# Patient Record
Sex: Female | Born: 1945 | Race: White | Hispanic: No | Marital: Married | Smoking: Current every day smoker
Health system: Southern US, Community
[De-identification: ages and names within clinical notes are randomized; demographics above are authoritative.]

## PROBLEM LIST (undated history)

## (undated) DIAGNOSIS — I10 Essential (primary) hypertension: Secondary | ICD-10-CM

## (undated) DIAGNOSIS — J449 Chronic obstructive pulmonary disease, unspecified: Secondary | ICD-10-CM

## (undated) DIAGNOSIS — I509 Heart failure, unspecified: Secondary | ICD-10-CM

---

## 1998-05-24 ENCOUNTER — Other Ambulatory Visit: Admission: RE | Admit: 1998-05-24 | Discharge: 1998-05-24 | Payer: Self-pay | Admitting: *Deleted

## 1999-05-06 ENCOUNTER — Inpatient Hospital Stay (HOSPITAL_COMMUNITY): Admission: AD | Admit: 1999-05-06 | Discharge: 1999-05-06 | Payer: Self-pay | Admitting: Obstetrics & Gynecology

## 1999-05-24 ENCOUNTER — Encounter: Payer: Self-pay | Admitting: Emergency Medicine

## 1999-05-24 ENCOUNTER — Encounter: Payer: Self-pay | Admitting: Orthopedic Surgery

## 1999-05-24 ENCOUNTER — Emergency Department (HOSPITAL_COMMUNITY): Admission: EM | Admit: 1999-05-24 | Discharge: 1999-05-24 | Payer: Self-pay | Admitting: Emergency Medicine

## 1999-06-08 ENCOUNTER — Other Ambulatory Visit: Admission: RE | Admit: 1999-06-08 | Discharge: 1999-06-08 | Payer: Self-pay | Admitting: *Deleted

## 1999-07-19 ENCOUNTER — Encounter: Admission: RE | Admit: 1999-07-19 | Discharge: 1999-08-08 | Payer: Self-pay | Admitting: Orthopedic Surgery

## 2001-05-22 ENCOUNTER — Other Ambulatory Visit: Admission: RE | Admit: 2001-05-22 | Discharge: 2001-05-22 | Payer: Self-pay | Admitting: *Deleted

## 2005-03-08 ENCOUNTER — Emergency Department (HOSPITAL_COMMUNITY): Admission: EM | Admit: 2005-03-08 | Discharge: 2005-03-08 | Payer: Self-pay | Admitting: Emergency Medicine

## 2008-06-08 ENCOUNTER — Encounter: Admission: RE | Admit: 2008-06-08 | Discharge: 2008-06-08 | Payer: Self-pay | Admitting: Emergency Medicine

## 2008-09-10 ENCOUNTER — Ambulatory Visit: Payer: Self-pay | Admitting: Internal Medicine

## 2008-09-10 ENCOUNTER — Inpatient Hospital Stay (HOSPITAL_COMMUNITY): Admission: EM | Admit: 2008-09-10 | Discharge: 2008-09-29 | Payer: Self-pay | Admitting: Emergency Medicine

## 2008-09-14 ENCOUNTER — Encounter: Payer: Self-pay | Admitting: Pulmonary Disease

## 2008-09-19 ENCOUNTER — Encounter: Payer: Self-pay | Admitting: Pulmonary Disease

## 2008-09-19 ENCOUNTER — Ambulatory Visit: Payer: Self-pay | Admitting: Vascular Surgery

## 2008-09-19 ENCOUNTER — Encounter (INDEPENDENT_AMBULATORY_CARE_PROVIDER_SITE_OTHER): Payer: Self-pay | Admitting: Internal Medicine

## 2008-09-22 ENCOUNTER — Encounter (INDEPENDENT_AMBULATORY_CARE_PROVIDER_SITE_OTHER): Payer: Self-pay | Admitting: *Deleted

## 2008-09-24 ENCOUNTER — Encounter (INDEPENDENT_AMBULATORY_CARE_PROVIDER_SITE_OTHER): Payer: Self-pay | Admitting: *Deleted

## 2008-09-25 ENCOUNTER — Ambulatory Visit: Payer: Self-pay | Admitting: Gastroenterology

## 2008-09-30 ENCOUNTER — Telehealth: Payer: Self-pay | Admitting: Internal Medicine

## 2008-10-05 DIAGNOSIS — J449 Chronic obstructive pulmonary disease, unspecified: Secondary | ICD-10-CM | POA: Insufficient documentation

## 2008-10-05 DIAGNOSIS — F3289 Other specified depressive episodes: Secondary | ICD-10-CM | POA: Insufficient documentation

## 2008-10-05 DIAGNOSIS — F329 Major depressive disorder, single episode, unspecified: Secondary | ICD-10-CM

## 2008-10-05 DIAGNOSIS — I80299 Phlebitis and thrombophlebitis of other deep vessels of unspecified lower extremity: Secondary | ICD-10-CM

## 2008-10-05 DIAGNOSIS — I10 Essential (primary) hypertension: Secondary | ICD-10-CM | POA: Insufficient documentation

## 2008-10-06 ENCOUNTER — Ambulatory Visit: Payer: Self-pay | Admitting: Internal Medicine

## 2008-10-06 DIAGNOSIS — J189 Pneumonia, unspecified organism: Secondary | ICD-10-CM

## 2008-10-12 ENCOUNTER — Telehealth (INDEPENDENT_AMBULATORY_CARE_PROVIDER_SITE_OTHER): Payer: Self-pay | Admitting: *Deleted

## 2008-10-20 ENCOUNTER — Ambulatory Visit: Payer: Self-pay | Admitting: Internal Medicine

## 2008-10-20 DIAGNOSIS — R21 Rash and other nonspecific skin eruption: Secondary | ICD-10-CM

## 2008-10-25 ENCOUNTER — Telehealth (INDEPENDENT_AMBULATORY_CARE_PROVIDER_SITE_OTHER): Payer: Self-pay | Admitting: *Deleted

## 2008-10-28 ENCOUNTER — Ambulatory Visit: Payer: Self-pay | Admitting: Family Medicine

## 2008-11-03 ENCOUNTER — Ambulatory Visit: Payer: Self-pay | Admitting: *Deleted

## 2008-11-29 ENCOUNTER — Ambulatory Visit: Payer: Self-pay | Admitting: Internal Medicine

## 2008-12-08 ENCOUNTER — Ambulatory Visit: Payer: Self-pay | Admitting: Family Medicine

## 2008-12-22 ENCOUNTER — Ambulatory Visit: Payer: Self-pay | Admitting: Family Medicine

## 2009-01-05 ENCOUNTER — Ambulatory Visit: Payer: Self-pay | Admitting: Internal Medicine

## 2009-01-10 ENCOUNTER — Ambulatory Visit: Payer: Self-pay | Admitting: Internal Medicine

## 2009-01-10 ENCOUNTER — Encounter: Payer: Self-pay | Admitting: Internal Medicine

## 2009-01-19 ENCOUNTER — Ambulatory Visit: Payer: Self-pay | Admitting: Internal Medicine

## 2009-01-19 ENCOUNTER — Ambulatory Visit: Payer: Self-pay | Admitting: Family Medicine

## 2009-01-19 LAB — CONVERTED CEMR LAB
ALT: 10 units/L (ref 0–35)
AST: 17 units/L (ref 0–37)
Albumin: 3.8 g/dL (ref 3.5–5.2)
Alkaline Phosphatase: 120 units/L — ABNORMAL HIGH (ref 39–117)
Basophils Absolute: 0.1 10*3/uL (ref 0.0–0.1)
Basophils Relative: 1 % (ref 0–1)
Glucose, Bld: 90 mg/dL (ref 70–99)
Hemoglobin: 12.7 g/dL (ref 12.0–15.0)
LDL Cholesterol: 156 mg/dL — ABNORMAL HIGH (ref 0–99)
Lymphocytes Relative: 29 % (ref 12–46)
MCHC: 31.9 g/dL (ref 30.0–36.0)
Monocytes Absolute: 0.7 10*3/uL (ref 0.1–1.0)
Neutro Abs: 3.5 10*3/uL (ref 1.7–7.7)
Neutrophils Relative %: 56 % (ref 43–77)
Potassium: 3.8 meq/L (ref 3.5–5.3)
Pro B Natriuretic peptide (BNP): 71.4 pg/mL (ref 0.0–100.0)
RBC: 4.4 M/uL (ref 3.87–5.11)
RDW: 14.7 % (ref 11.5–15.5)
Sodium: 141 meq/L (ref 135–145)
TSH: 9.696 microintl units/mL — ABNORMAL HIGH (ref 0.350–4.500)
Total Bilirubin: 0.4 mg/dL (ref 0.3–1.2)
Total Protein: 6.8 g/dL (ref 6.0–8.3)
VLDL: 24 mg/dL (ref 0–40)
Vit D, 25-Hydroxy: 17 ng/mL — ABNORMAL LOW (ref 30–89)

## 2009-01-31 ENCOUNTER — Ambulatory Visit: Payer: Self-pay | Admitting: Family Medicine

## 2009-03-01 ENCOUNTER — Ambulatory Visit: Payer: Self-pay | Admitting: Internal Medicine

## 2009-04-03 ENCOUNTER — Ambulatory Visit: Payer: Self-pay | Admitting: Internal Medicine

## 2009-06-05 ENCOUNTER — Ambulatory Visit: Payer: Self-pay | Admitting: Internal Medicine

## 2009-06-05 ENCOUNTER — Encounter: Payer: Self-pay | Admitting: Family Medicine

## 2009-06-05 LAB — CONVERTED CEMR LAB
ALT: 14 units/L (ref 0–35)
Alkaline Phosphatase: 165 units/L — ABNORMAL HIGH (ref 39–117)
CO2: 32 meq/L (ref 19–32)
Creatinine, Ser: 0.92 mg/dL (ref 0.40–1.20)
Sodium: 143 meq/L (ref 135–145)
TSH: 4.16 microintl units/mL (ref 0.350–4.500)
Total Bilirubin: 0.6 mg/dL (ref 0.3–1.2)
Total Protein: 7.2 g/dL (ref 6.0–8.3)

## 2009-09-07 ENCOUNTER — Telehealth (INDEPENDENT_AMBULATORY_CARE_PROVIDER_SITE_OTHER): Payer: Self-pay | Admitting: *Deleted

## 2009-09-07 ENCOUNTER — Ambulatory Visit: Payer: Self-pay | Admitting: Family Medicine

## 2009-09-07 ENCOUNTER — Ambulatory Visit (HOSPITAL_COMMUNITY): Admission: RE | Admit: 2009-09-07 | Discharge: 2009-09-07 | Payer: Self-pay | Admitting: Family Medicine

## 2009-10-06 ENCOUNTER — Ambulatory Visit (HOSPITAL_COMMUNITY): Admission: RE | Admit: 2009-10-06 | Discharge: 2009-10-06 | Payer: Self-pay | Admitting: Family Medicine

## 2009-10-06 ENCOUNTER — Ambulatory Visit: Payer: Self-pay | Admitting: Family Medicine

## 2009-11-24 ENCOUNTER — Ambulatory Visit: Payer: Self-pay | Admitting: Family Medicine

## 2009-11-24 LAB — CONVERTED CEMR LAB
CO2: 32 meq/L (ref 19–32)
Calcium: 9.7 mg/dL (ref 8.4–10.5)
Potassium: 3.9 meq/L (ref 3.5–5.3)
Sodium: 142 meq/L (ref 135–145)
Vit D, 25-Hydroxy: 23 ng/mL — ABNORMAL LOW (ref 30–89)

## 2010-02-20 ENCOUNTER — Ambulatory Visit (HOSPITAL_COMMUNITY): Admission: RE | Admit: 2010-02-20 | Discharge: 2010-02-20 | Payer: Self-pay | Admitting: Family Medicine

## 2010-02-20 ENCOUNTER — Ambulatory Visit: Payer: Self-pay | Admitting: Family Medicine

## 2010-03-22 ENCOUNTER — Ambulatory Visit: Payer: Self-pay | Admitting: Family Medicine

## 2010-04-11 ENCOUNTER — Encounter: Admission: RE | Admit: 2010-04-11 | Discharge: 2010-06-07 | Payer: Self-pay | Admitting: Family Medicine

## 2010-04-30 IMAGING — CR DG CHEST 2V
1 series · 1 of 1 positions shown · non-contrast
Comparison: 09/10/2008

CLINICAL DATA: Pneumonia, hyponatremia

CHEST - 2 VIEW

[view not recorded]
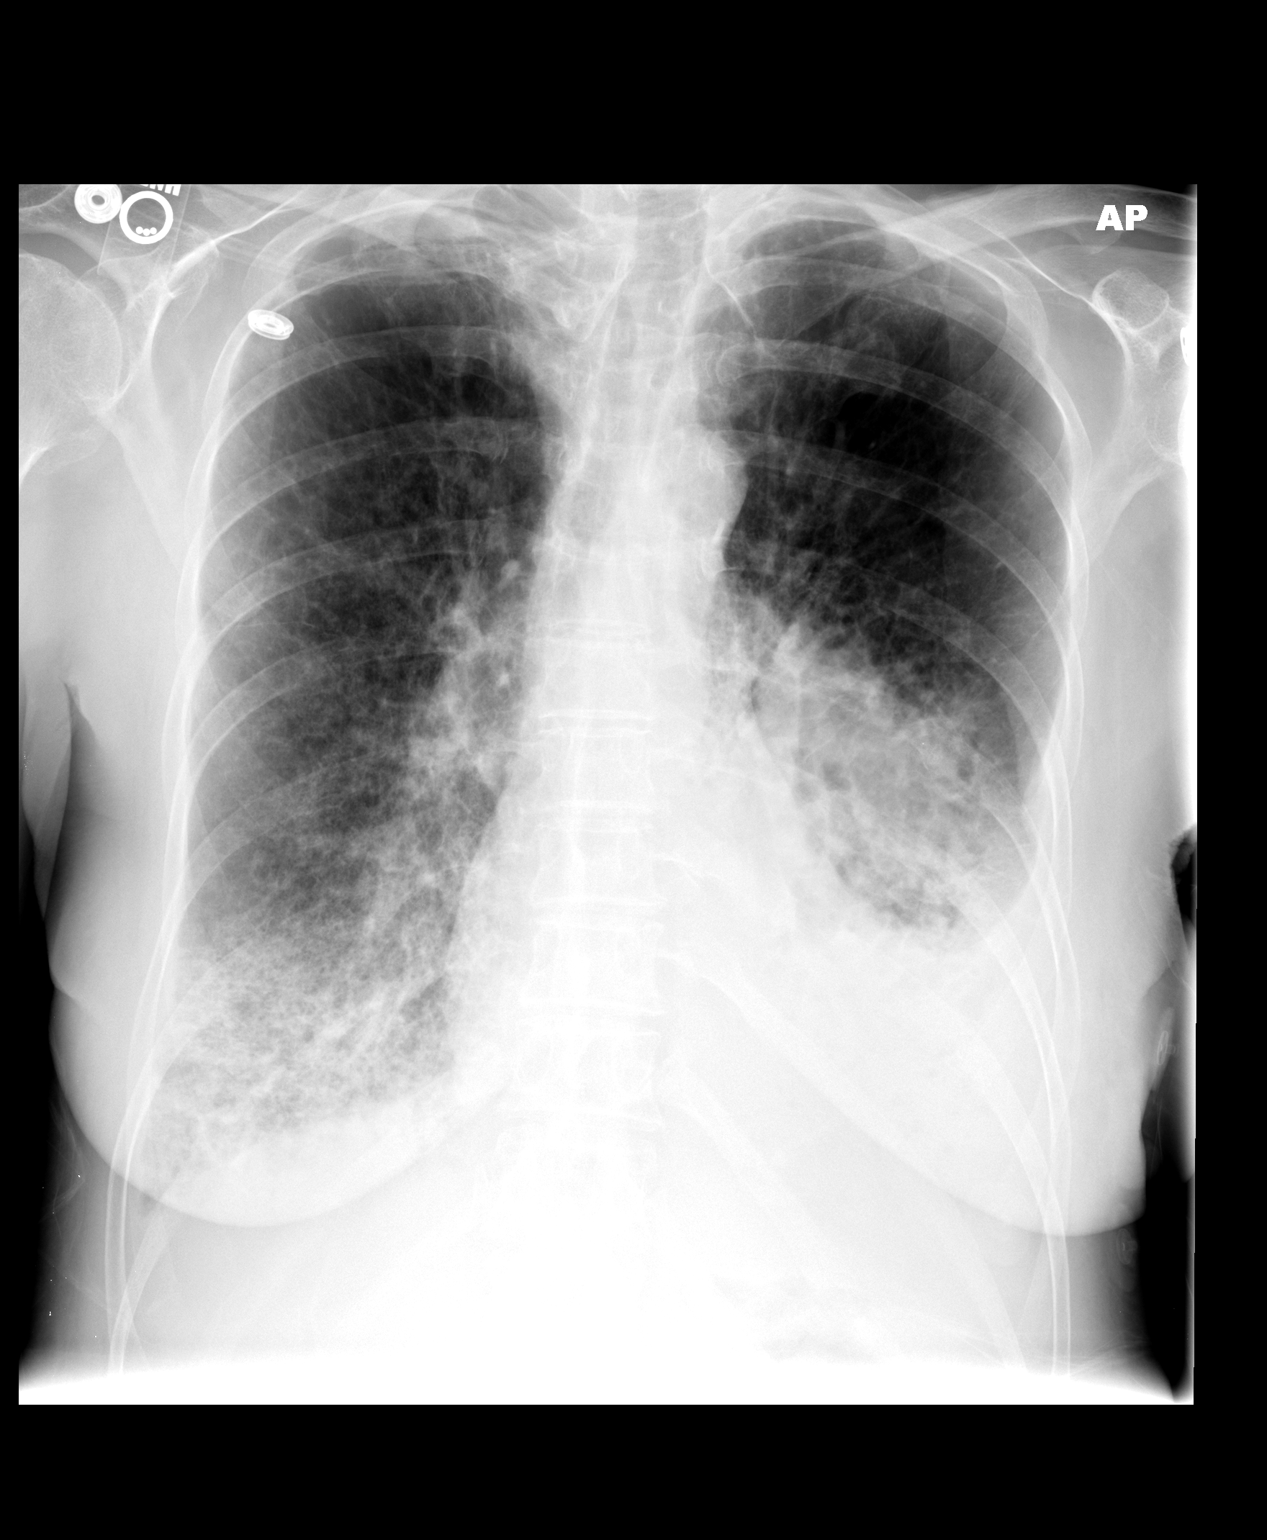

[1 of 1 positions shown; findings below may reference images not displayed]

FINDINGS: Slight worsening of the extensive bilateral airspace
disease involving the lower lobes and lingula.  Dense consolidation
in the left mid lung.  Pleural effusions are present bilaterally,
larger on the left.  Background emphysema is suspected with apical
scarring.  No pneumothorax.  Underlying mass particularly in the
left hilum is not excluded.
IMPRESSION: Worsening bilateral pneumonia with pleural effusions.  Extensive
left lung consolidation could obscure an underlying central mass.

## 2010-05-02 IMAGING — CR DG CHEST 1V PORT
1 series · 1 of 1 positions shown · non-contrast
Comparison: 1 day prior

CLINICAL DATA: Respiratory failure.  Hypoxia.

PORTABLE CHEST - 1 VIEW

[AP]
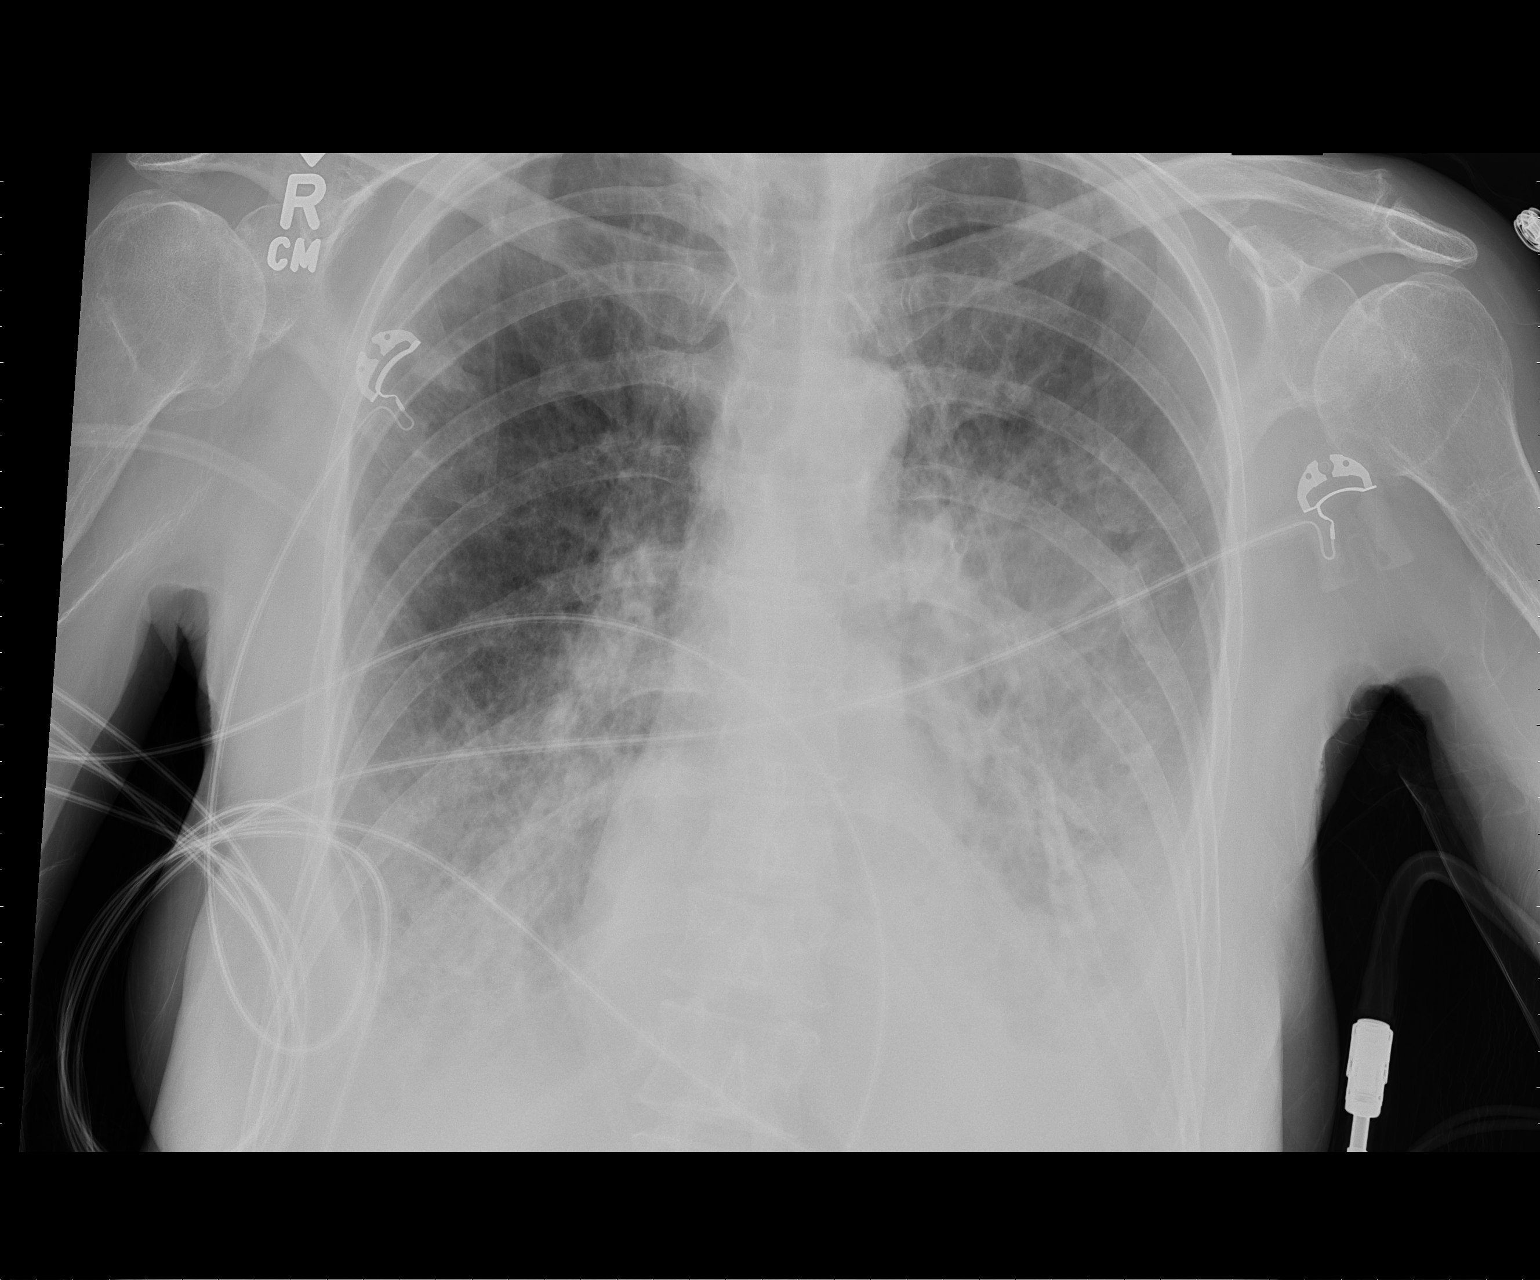

[1 of 1 positions shown; findings below may reference images not displayed]

FINDINGS: Mild osteopenia.  Remote left rib fracture. Midline
trachea. Normal heart size for level of inspiration.  Similar small
left and worsening small right pleural effusion. No pneumothorax.
Hyperinflation.  Increase in moderate interstitial edema.  Worsened
right and similar left lower lung airspace disease.
IMPRESSION: Overall worsening of aeration.  Likely a combination of bilateral
infection/aspiration, pulmonary edema, and progressive pleural
effusions.

## 2010-05-05 IMAGING — CR DG CHEST 2V
1 series · 1 of 1 positions shown · non-contrast
Comparison: 09/13/2008

CLINICAL DATA: Hyponatremia and pneumonia

CHEST - 2 VIEW

[view not recorded]
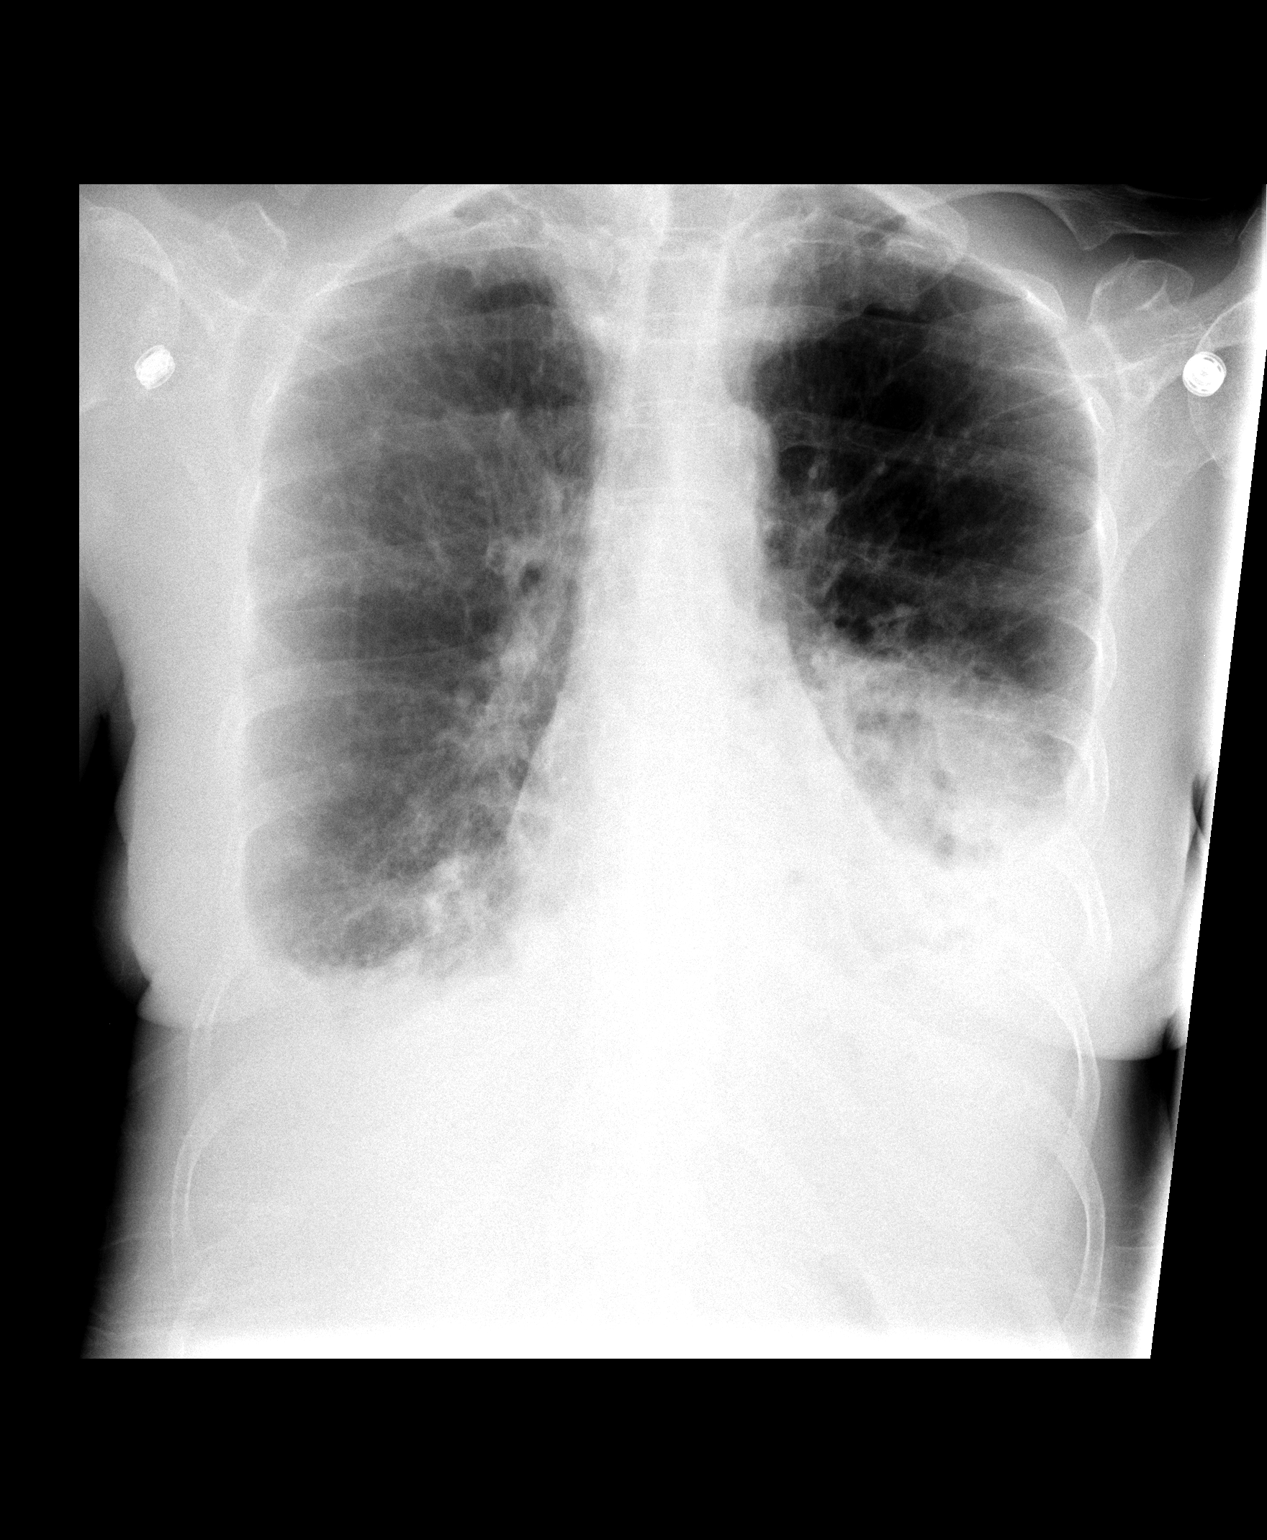

[1 of 1 positions shown; findings below may reference images not displayed]

FINDINGS: Consolidation throughout the left lung has improved.
Airspace disease at the right base has improved.  Bilateral
effusions are not significantly changed.  No pneumothorax.
IMPRESSION: Improved bilateral airspace disease.  Stable effusions.

## 2010-07-27 ENCOUNTER — Encounter (INDEPENDENT_AMBULATORY_CARE_PROVIDER_SITE_OTHER): Payer: Self-pay | Admitting: *Deleted

## 2010-07-27 LAB — CONVERTED CEMR LAB
CO2: 32 meq/L (ref 19–32)
Chloride: 102 meq/L (ref 96–112)
Creatinine, Ser: 1.25 mg/dL — ABNORMAL HIGH (ref 0.40–1.20)
Glucose, Bld: 81 mg/dL (ref 70–99)
Sodium: 144 meq/L (ref 135–145)

## 2010-11-15 ENCOUNTER — Encounter (INDEPENDENT_AMBULATORY_CARE_PROVIDER_SITE_OTHER): Payer: Self-pay | Admitting: Family Medicine

## 2010-11-15 LAB — CONVERTED CEMR LAB
Basophils Absolute: 0 10*3/uL (ref 0.0–0.1)
Basophils Relative: 1 % (ref 0–1)
Calcium: 9.8 mg/dL (ref 8.4–10.5)
Cholesterol: 225 mg/dL — ABNORMAL HIGH (ref 0–200)
Creatinine, Ser: 1.05 mg/dL (ref 0.40–1.20)
Eosinophils Relative: 3 % (ref 0–5)
HCT: 43.7 % (ref 36.0–46.0)
HDL: 53 mg/dL (ref >39)
Hemoglobin: 14.5 g/dL (ref 12.0–15.0)
MCHC: 33.2 g/dL (ref 30.0–36.0)
Monocytes Absolute: 0.7 10*3/uL (ref 0.1–1.0)
Platelets: 210 10*3/uL (ref 150–400)
RDW: 12.5 % (ref 11.5–15.5)
Triglycerides: 131 mg/dL (ref <150)

## 2010-12-31 LAB — CBC
HCT: 27.3 % — ABNORMAL LOW (ref 36.0–46.0)
HCT: 27.3 % — ABNORMAL LOW (ref 36.0–46.0)
HCT: 27.9 % — ABNORMAL LOW (ref 36.0–46.0)
HCT: 28 % — ABNORMAL LOW (ref 36.0–46.0)
HCT: 29.4 % — ABNORMAL LOW (ref 36.0–46.0)
HCT: 29.6 % — ABNORMAL LOW (ref 36.0–46.0)
HCT: 31.1 % — ABNORMAL LOW (ref 36.0–46.0)
Hemoglobin: 10 g/dL — ABNORMAL LOW (ref 12.0–15.0)
Hemoglobin: 10.3 g/dL — ABNORMAL LOW (ref 12.0–15.0)
Hemoglobin: 9.6 g/dL — ABNORMAL LOW (ref 12.0–15.0)
Hemoglobin: 9.8 g/dL — ABNORMAL LOW (ref 12.0–15.0)
MCHC: 32.5 g/dL (ref 30.0–36.0)
MCHC: 32.7 g/dL (ref 30.0–36.0)
MCHC: 32.8 g/dL (ref 30.0–36.0)
MCHC: 32.8 g/dL (ref 30.0–36.0)
MCHC: 32.9 g/dL (ref 30.0–36.0)
MCHC: 33.1 g/dL (ref 30.0–36.0)
MCHC: 33.2 g/dL (ref 30.0–36.0)
MCHC: 33.5 g/dL (ref 30.0–36.0)
MCV: 92.8 fL (ref 78.0–100.0)
MCV: 93.1 fL (ref 78.0–100.0)
MCV: 93.5 fL (ref 78.0–100.0)
MCV: 93.6 fL (ref 78.0–100.0)
MCV: 93.8 fL (ref 78.0–100.0)
MCV: 94.2 fL (ref 78.0–100.0)
MCV: 94.7 fL (ref 78.0–100.0)
Platelets: 275 10*3/uL (ref 150–400)
Platelets: 300 10*3/uL (ref 150–400)
Platelets: 305 10*3/uL (ref 150–400)
Platelets: 416 10*3/uL — ABNORMAL HIGH (ref 150–400)
RBC: 2.89 MIL/uL — ABNORMAL LOW (ref 3.87–5.11)
RBC: 2.9 MIL/uL — ABNORMAL LOW (ref 3.87–5.11)
RBC: 2.95 MIL/uL — ABNORMAL LOW (ref 3.87–5.11)
RBC: 3.01 MIL/uL — ABNORMAL LOW (ref 3.87–5.11)
RBC: 3.12 MIL/uL — ABNORMAL LOW (ref 3.87–5.11)
RBC: 3.28 MIL/uL — ABNORMAL LOW (ref 3.87–5.11)
RDW: 13.1 % (ref 11.5–15.5)
RDW: 13.2 % (ref 11.5–15.5)
RDW: 13.4 % (ref 11.5–15.5)
RDW: 13.4 % (ref 11.5–15.5)
RDW: 13.5 % (ref 11.5–15.5)
RDW: 14.3 % (ref 11.5–15.5)
WBC: 11.5 10*3/uL — ABNORMAL HIGH (ref 4.0–10.5)
WBC: 24.5 10*3/uL — ABNORMAL HIGH (ref 4.0–10.5)
WBC: 27 10*3/uL — ABNORMAL HIGH (ref 4.0–10.5)
WBC: 9.7 10*3/uL (ref 4.0–10.5)

## 2010-12-31 LAB — BASIC METABOLIC PANEL
BUN: 8 mg/dL (ref 6–23)
CO2: 33 mEq/L — ABNORMAL HIGH (ref 19–32)
CO2: 35 mEq/L — ABNORMAL HIGH (ref 19–32)
CO2: 36 mEq/L — ABNORMAL HIGH (ref 19–32)
Calcium: 7.5 mg/dL — ABNORMAL LOW (ref 8.4–10.5)
Calcium: 7.8 mg/dL — ABNORMAL LOW (ref 8.4–10.5)
Calcium: 7.8 mg/dL — ABNORMAL LOW (ref 8.4–10.5)
Chloride: 95 mEq/L — ABNORMAL LOW (ref 96–112)
Creatinine, Ser: 0.68 mg/dL (ref 0.4–1.2)
GFR calc Af Amer: >60 mL/min (ref >60)
GFR calc non Af Amer: >60 mL/min (ref >60)
Glucose, Bld: 111 mg/dL — ABNORMAL HIGH (ref 70–99)
Glucose, Bld: 136 mg/dL — ABNORMAL HIGH (ref 70–99)
Potassium: 3.3 mEq/L — ABNORMAL LOW (ref 3.5–5.1)
Sodium: 136 mEq/L (ref 135–145)
Sodium: 140 mEq/L (ref 135–145)

## 2010-12-31 LAB — CK TOTAL AND CKMB (NOT AT ARMC)
CK, MB: 1.3 ng/mL (ref 0.3–4.0)
CK, MB: 1.3 ng/mL (ref 0.3–4.0)
Relative Index: INVALID (ref 0.0–2.5)

## 2010-12-31 LAB — PROTIME-INR
INR: 1.4 (ref 0.00–1.49)
INR: 1.9 — ABNORMAL HIGH (ref 0.00–1.49)
INR: 3.5 — ABNORMAL HIGH (ref 0.00–1.49)
INR: 4.4 — ABNORMAL HIGH (ref 0.00–1.49)
INR: 6.5 (ref 0.00–1.49)
Prothrombin Time: 17.4 seconds — ABNORMAL HIGH (ref 11.6–15.2)
Prothrombin Time: 21.6 seconds — ABNORMAL HIGH (ref 11.6–15.2)
Prothrombin Time: 22.6 seconds — ABNORMAL HIGH (ref 11.6–15.2)
Prothrombin Time: 38.4 seconds — ABNORMAL HIGH (ref 11.6–15.2)
Prothrombin Time: 52.8 seconds — ABNORMAL HIGH (ref 11.6–15.2)
Prothrombin Time: 64 seconds — ABNORMAL HIGH (ref 11.6–15.2)

## 2010-12-31 LAB — BLOOD GAS, ARTERIAL
Acid-Base Excess: 7.8 mmol/L — ABNORMAL HIGH (ref 0.0–2.0)
Acid-Base Excess: 8.4 mmol/L — ABNORMAL HIGH (ref 0.0–2.0)
Drawn by: 27733
O2 Content: 4 L/min
O2 Content: 4 L/min
O2 Saturation: 94.2 %
Patient temperature: 98.6
TCO2: 33.3 mmol/L (ref 0–100)
pCO2 arterial: 45.5 mmHg — ABNORMAL HIGH (ref 35.0–45.0)
pH, Arterial: 7.46 — ABNORMAL HIGH (ref 7.350–7.400)
pO2, Arterial: 100 mmHg (ref 80.0–100.0)
pO2, Arterial: 69.6 mmHg — ABNORMAL LOW (ref 80.0–100.0)

## 2010-12-31 LAB — EXPECTORATED SPUTUM ASSESSMENT W GRAM STAIN, RFLX TO RESP C

## 2010-12-31 LAB — CLOSTRIDIUM DIFFICILE EIA

## 2010-12-31 LAB — COMPREHENSIVE METABOLIC PANEL
AST: 20 U/L (ref 0–37)
BUN: 30 mg/dL — ABNORMAL HIGH (ref 6–23)
CO2: 33 mEq/L — ABNORMAL HIGH (ref 19–32)
Chloride: 102 mEq/L (ref 96–112)
Creatinine, Ser: 1.02 mg/dL (ref 0.4–1.2)
GFR calc non Af Amer: 55 mL/min — ABNORMAL LOW (ref >60)
Total Bilirubin: 0.6 mg/dL (ref 0.3–1.2)

## 2010-12-31 LAB — CULTURE, RESPIRATORY W GRAM STAIN: Culture: NO GROWTH

## 2010-12-31 LAB — VANCOMYCIN, TROUGH: Vancomycin Tr: 14.7 ug/mL (ref 10.0–20.0)

## 2010-12-31 LAB — URINALYSIS, ROUTINE W REFLEX MICROSCOPIC
Bilirubin Urine: NEGATIVE
Ketones, ur: NEGATIVE mg/dL
Nitrite: NEGATIVE
Specific Gravity, Urine: 1.02 (ref 1.005–1.030)
Urobilinogen, UA: 0.2 mg/dL (ref 0.0–1.0)

## 2010-12-31 LAB — TROPONIN I
Troponin I: 0.01 ng/mL (ref 0.00–0.06)
Troponin I: 0.01 ng/mL (ref 0.00–0.06)

## 2010-12-31 LAB — PREPARE FRESH FROZEN PLASMA

## 2010-12-31 LAB — HEMOCCULT GUIAC POC 1CARD (OFFICE): Fecal Occult Bld: POSITIVE

## 2010-12-31 LAB — HEMATOCRIT: HCT: 25.8 % — ABNORMAL LOW (ref 36.0–46.0)

## 2011-01-29 NOTE — Discharge Summary (Signed)
NAMELAQUETTA, RACEY              ACCOUNT NO.:  [id number]   MEDICAL RECORD NO.:  [id number]          PATIENT TYPE:  INP   LOCATION:  3019                         FACILITY:  [facility]   PHYSICIAN:  Charlestine Massed, MDDATE OF BIRTH:  1945/10/02   DATE OF ADMISSION:  09/10/2008  DATE OF DISCHARGE:                               DISCHARGE SUMMARY   ADDENDUM TO DISCHARGE SUMMARY:  An extension to the discharge summary  that was dictated on September 26, 2008, by Dr. Kathryne Hitch.   PRIMARY CARE PHYSICIAN:  Reuben Likes, M.D.   PULMONOLOGIST:  Charlaine Dalton. Sherene Sires, MD, FCCP   DISCHARGE DIAGNOSES:  1. Necrotizing pneumonia with cavitation in the left lower lobe.  2. 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  3. Bilateral pleural effusion.  4. Chronic bronchitis with COPD.  5. Hypertension.  6. Depression.   DISCHARGE MEDICATIONS:  1. Clindamycin 300 mg p.o. q.6 h. for another two months and three      weeks.  2. Avelox 400 mg p.o. daily for two months and three weeks.  3. Diltiazem 60 mg p.o. b.i.d.  4. Lasix 60 mg p.o. b.i.d.  5. Ferrous sulfate 325 mg p.o. b.i.d. with meals.  6. Remeron 30 mg p.o. q.h.s.  7. Trental 400 mg p.o. daily.  8. Megace 100 mg p.o. daily p.r.n. poor appetite.  9. Pacerone 50 mg p.o. q.h.s.  10.Spiriva 18 mcg one dose inhalation daily.  11.Advair 250/50 one puff b.i.d.  12.Nicotine patch 40 mg per day for one week and then 5 mg per day for      one week and then stop.  13.Coumadin, extra dose of Coumadin at 1 mg starting October 01, 2008      and INR check at home on October 02, 2008, for further dosing.  14.Mucinex 200 mg p.o. b.i.d.   HOSPITAL COURSE:  After the previous hospital course as dictated in the  previous discharge summary stay.   The patient was planned to be discharged on January 13, but there was a  rise in the WBC count noted which was 30,000 WBC count.  The patient was  not having any fevers, was not toxic looking.  No diarrhea.  No  nausea,  vomiting or other issues.  In view of the patient being on clindamycin  and prior history of being on multiple antibiotics including vancomycin,  Primaxin and Avelox which was checked for C. difficile in the stools,  two samples of stool sent for C. difficile turned out negative.  Repeat  chest x-ray was done which showed a cavitation in the left lower lobe in  the area of pneumonia with wall thickness.   Pulmonary reconsult follow up was called for.  They reviewed the x-rays  and pulmonology is aware of the fact that the patient has a cavity and  necrotizing pneumonia.  There is no evidence of any abscess.  The  patient is asymptomatic.  The patient can be discharged home, and  cavitation is the next complication with necrotizing pneumonitis.  Right  now, Pulmonary addresses there is no plan to go on for a  left lower  lobectomy.  Will observe the patient.  If there is any complication,  will need to follow up with Pulmonology as outpatient in another four  weeks for further checkup.  So, the patient is being discharged home on  clindamycin, Avelox for a total of three months for a necrotizing  pneumonia.   DISPOSITION:  The patient is discharged back home with home care and Home Health.  INR  check to be done by Home Health nurse at home, and this hopes to be  called to Dr. Leslee Home, primary care physician for further Coumadin  dosing.  A total of 45 minutes was spent on this discharge.      Charlestine Massed, MD  Electronically Signed     UT/MEDQ  D:  09/29/2008  T:  09/29/2008  Job:  [id number]   cc:   Reuben Likes, M.D.  Charlaine Dalton. Sherene Sires, MD, FCCP

## 2011-01-29 NOTE — H&P (Signed)
[name], [name]              ACCOUNT NO.:  [id number]   MEDICAL RECORD NO.:  [id number]          PATIENT TYPE:  INP   LOCATION:  3307                         FACILITY:  [facility]   PHYSICIAN:  Lonia Blood, M.D.      DATE OF BIRTH:  08/25/1946   DATE OF ADMISSION:  09/10/2008  DATE OF DISCHARGE:                              HISTORY & PHYSICAL   PRIMARY CARE PHYSICIAN:  Dr. Leslee Home.   PRESENTING COMPLAINT:  Nausea, vomiting, diarrhea and foot swell.   HISTORY OF PRESENT ILLNESS:  The patient is a 65 year old female with  known history of hypertension, back pain, degenerative disk disease who  apparently has been sick for about a week.  Her illness started with  some cold symptoms then patient developed nausea, vomiting and diarrhea  which has been going on for almost a week.  For the past couple of days  also she started having cough which was productive of brown colored  sputum.  Denied any hemoptysis.  She has shortness of breath associated  with that and mild chest pain.  Described as pleuritic.  Had nausea,  vomiting, diarrhea, has persisted and she has been unable to keep  anything down.  Denied any bright red blood per rectum or melena.  Denied any hematemesis.  She has some abdominal cramps but no pain.  She  has lost about 5 pounds over the past week.  The patient denied any sick  contacts.  She is however weak generally, but nonfocal.  Denied any  dysuria or discharge or any other primary or any other urinary symptoms.   PAST MEDICAL HISTORY:  Significant for hypertension, degenerative disk  disease.   ALLERGIES:  SHE IS ALLERGIC TO PENICILLIN AND CODEINE.   MEDICATIONS:  1. Mirtazapine 30 mg nightly.  2. Lisinopril 10 mg daily.  3. Hydrochlorothiazide 12.5 mg daily.  4. Tramadol 50 mg daily.  5. Naproxen 500 mg q.6 hours p.r.n.  6. Pentoxifylline ER 400 mg daily.  7. Patient has been taking ciprofloxacin, 500 mg b.i.d. for the past 4      days and as well as  Phenergan 25 mg q. 6 hours p.r.n..   SOCIAL HISTORY:  The patient lives in [place] with her husband and  family.  She smokes about one and a half pack per day.  No alcohol or IV  drug use.   FAMILY HISTORY:  Significant for hypertension, diabetes.   REVIEW OF SYSTEMS:  Review of systems negative except per HPI.   EXAM:  Temperature was 35.6 through the rectum.  The patient was cold  and clammy with a blood pressure of 77/54 on arrival with a pulse of  115, respiratory rate 21, sats 81% on room air.  Her blood pressure is  currently 106/80 after a bolus of fluid, 2 liters IV normal saline.  Generally she is acutely ill-looking, in obvious distress.  HEENT:  PERRLA, EOMI.  NECK:  Supple, no JVD, no lymphadenopathy.  RESPIRATORY:  She has good air entry bilaterally.  No wheezes or rales.  CARDIOVASCULAR:  The patient is tachycardiac but no murmur,  no rub.  ABDOMEN:  Soft, nontender, no organomegaly with positive bowel sounds.  EXTREMITIES:  Showed 1+ pedal edema bilaterally and which is pitting.   LABS:  Sodium 117, potassium 4.1, chloride 86, BUN 67, creatinine 1.7,  glucose 112,  ionized calcium 1.05 and calcium of 8.  Lactic acid is  4.2.  pH 7.351, PCO2 37 and PO2 of 69 with an oxygen sat of 93.  Her  white count is 31,000 with hemoglobin 14.6 and left shift ANC of 96%  neutrophils.  Platelet count 184.  Chest x-ray showed findings  compatible with pneumonia, extensive, involving particularly the left  lung.  Extensive consolidation probably simulating a mass.  Mass can not  be excluded with certainty and recommended follow-up chest x-ray.   ASSESSMENT:  Therefore this is a 65 year old female presenting with what  appears to be left lower lobe pneumonia without a mass.  Also severe,  dehydration, acute renal failure secondary to nausea, vomiting and  diarrhea.  The patient's lactic acidosis may be secondary to her  profound hypovolemia which has now corrected.  The patient  was clearly  septic but not in shock this point.   PLAN:  1. Pneumonia, will admit the patient, start IV antibiotics.      Resuscitate her with fluid.  Follow her closely.  Get blood      cultures and sputum cultures.  I will follow resolution of the      patient's pneumonia.  I will use a combination of vancomycin,      Rocephin and Zithromax at this point since the patient is      penicillin allergic until we get some form of cultures back.  2. Acute renal failure, most likely is prerenal from her nausea,      vomiting and diarrhea.  We will do some stool studies and      symptomatic management of nausea, vomiting, diarrhea and give her      lots of fluids to keep her blood above 60.  3. Tobacco abuse.  The patient will be given nicotine patch as well as      counseling.  4. Chronic back pain.  This is chronic, the patient has been on      tramadol as well as naproxen.  Will hold then NSAIDs in the setting      of acute renal failure at this point.  5. History of hypertension.  Again the patient was on ACE inhibitor      and diuretic.  Will hold both since the patient now has acute renal      failure.  6. Hyponatremia.  The patient's sodium is low.  With possible lung      mass this could be reflection of SIADH but again she is dry.  Will      give saline extensively and continue to monitor her sodium and see      if it rises.  Further treatment will depend on how the patient      responds to these initial measures in the hospital.      Lonia Blood, M.D.  Electronically Signed     LG/MEDQ  D:  09/10/2008  T:  09/10/2008  Job:  [id number]

## 2011-01-29 NOTE — Consult Note (Signed)
[name], [name]              ACCOUNT NO.:  [id number]   MEDICAL RECORD NO.:  [id number]          PATIENT TYPE:  INP   LOCATION:  2103                         FACILITY:  [facility]   PHYSICIAN:  Charlaine Dalton. Sherene Sires, MD, FCCPDATE OF BIRTH:  April 04, 1946   DATE OF CONSULTATION:  09/12/2008  DATE OF DISCHARGE:                                 CONSULTATION   REQUESTING PHYSICIAN:  Lonia Blood, MD   REASON FOR CONSULTATION:  Pneumonia/respiratory failure.   CHIEF COMPLAINT:  Cough and dyspnea.   HISTORY:  This is a 65 year old white female long-term smoker who denies  significant chronic respiratory complaints but was admitted to the  hospital acutely on September 10, 2008 with nausea, vomiting, diarrhea  and leg swelling and found to have extensive left-sided pneumonia and we  were asked to evaluate today, September 12, 2008.   The patient says she has not improved since she has been here.  Her  chief complaint at this point is that she is coughing up brown to  slightly red rusty mucous but has minimal dyspnea at rest, no pleuritic  pain, myalgias, arthralgias, difficulty swallowing active sinus or  reflux symptoms.  She says usually she is short of breath doing more  than slow ADLs, but does not use any home respiratory medications or  nebulizers, inhalers etc.   She denies any recent dental work or difficulty swallowing or unusual  exposure history.   The patient was treated for 4 days with Cipro prior to admit and did not  improve.  Since admission, she has been placed on vancomycin, Zithromax  and Rocephin.   PAST MEDICAL HISTORY:  Significant for hypertension and chronic back  pain secondary to degenerative arthritis.   ALLERGIES:  She is allergic to PENICILLIN and CODEINE.   OUTPATIENT MEDICATIONS:  1. Remeron 30 mg at bedtime.  2. Lisinopril 10 mg daily.  3. Hydrochlorothiazide 12.5 mg daily.  4. Tramadol 50 mg daily.  5. Naproxen 500 mg every 6 hours p.r.n.  6.  Pentoxifylline 400 mg daily.  7. As an outpatient, she had been on Cipro 4 days prior to admission.   SOCIAL HISTORY:  She continues to smoke about a pack.  The patient lives  in [place] with her husband.  She denies any alcohol use.   FAMILY HISTORY:  Negative for respiratory disease.  Positive for  hypertension and diabetes.   REVIEW OF SYSTEMS:  Taken in detail and negative except as outlined  above.   PHYSICAL EXAMINATION:  GENERAL:  This is a chronically ill-appearing  white female who appears older than stated age.  VITAL SIGNS:  She is afebrile with normal vital signs.  She has a  saturation recorded at 90% on 3 L.  HEENT:  Negative for turbinate edema, crusting or ulceration.  Oropharynx is clear with no evidence of excessive postnasal drainage or  cobblestoning.  NECK:  Supple without cervical adenopathy or tenderness.  Trachea is  midline, no thyromegaly.  LUNGS:  Coarse bronchovesicular change throughout the left lower lobe  posteriorly with no significant expiratory wheeze.  There is regular  rate and  rhythm with a distant S1-S2.  Hoover sign is positive at mid  inspiration.  There is dullness at the left base. ABDOMEN:  Soft, benign  with no palpable organomegaly, mass or tenderness.  EXTREMITIES:  Warm without calf tenderness, cyanosis or clubbing.  There  is trace edema bilaterally.   LABORATORY DATA:  Admission sodium was 118 and 139 today.  White count  was 31,000 and 19,000 today at 1600 today.  Bicarb levels are not  elevated, BNP is unremarkable.  TSH is unremarkable.   Chest CT scan was reviewed from September 11, 2008 and it shows extensive  necrotizing pneumonia of the left base but no significant pleural  effusion. Air bronchograms are present strongly ruling against an  obstructing malignancy, though she is certainly at risk of lung cancer  and will need close fu serially to make sure this is not the case.   IMPRESSION:  Extensive necrotizing  pneumonia in the left lower lobe in a  patient with probable significant underlying chronic obstructive  pulmonary disease from smoking with O2 dependent hypoxemic respiratory  failure but minimal increased work of breathing at rest.  The  differential diagnosis for necrotizing pneumonia includes an outpatient  methicillin-resistant Staphylococcus aureus, Gram-negative rods and  Legionella.  The acute onset of the illness in the context of a recent  cold might indicate that this is a complication of influenza and Staph  aureus needs to be covered until we are sure it is not present.   There is no indication at this point for any pulmonary intervention.  I  would however make the following recommendations:  1. Change Zithromax to Avelox.  This will double cover Gram-negatives      and also cover atypical organisms like Legionella both of which      might cause necrotizing pneumonia.  2. Switch Rocephin to Primaxin for better Gram-negative coverage.  3. Continue vancomycin pending culture results.   I have added Mucinex and a flutter valve to try to help with mucociliary  function which should improve the longer she stays off of cigarettes.   In terms of her oxygen dependency, she states she has never required  oxygen before but presently is only 90% at 3 L and we will need to see  how she does over her hospitalization to see if she can be discharged  off of oxygen or not.      Charlaine Dalton. Sherene Sires, MD, [facility]  Electronically Signed     MBW/MEDQ  D:  09/12/2008  T:  09/12/2008  Job:  [id number]   cc:   Reuben Likes, M.D.

## 2011-01-29 NOTE — Discharge Summary (Signed)
[name], [name]              ACCOUNT NO.:  [id number]   MEDICAL RECORD NO.:  [id number]          PATIENT TYPE:  INP   LOCATION:  3019                         FACILITY:  [facility]   PHYSICIAN:  Herbie Saxon, MDDATE OF BIRTH:  [birth year]   DATE OF ADMISSION:  09/10/2008  DATE OF DISCHARGE:                               DISCHARGE SUMMARY   CONSULTS:  Dr. Sherene Sires, Pulmonary Critical Care.   DISCHARGE DIAGNOSES:  1. Necrotizing pneumonia.  2. Bilateral pleural effusion.  3. Acute on chronic bronchitis.  4. Chronic obstructive pulmonary disease exacerbation.  5. Chronic back pain.  6. Hypokalemia, repleted.  7. Hypophosphatemia, supplemented.  8. Tobacco abuse.  9. Hypertension, improved.  10.Tachycardia, improved.  11.History of depression.  12.Acute on chronic respiratory failure, improved.  13.Abdominal aortic atherosclerosis.  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   RADIOLOGY:  Chest x-ray of June 17, 2008, shows bilateral airspace  disease, left greater than right, with palpitation on the left and small  bilateral pleural effusions.  Chest x-ray on September 16, 2008, showed  improved bilateral airspace disease and stable effusions.  CT of the  chest on September 12, 2008, she has complete consolidation of the left  lower lobe with palpitation and clearly seen bronchiectasis.  The  appearance is most compatible with necrotizing pneumonia.  This small  associated left pleural effusion, which may be partially located.  The  right lower lobe pneumonia is less severe than that on the left.  Small-  volume ascites in the upper abdomen.  Etiology of the severe proximal  abdominal aortic atherosclerosis and a mural thrombus which may be  affected in the main renal arteries.  Chest x-ray on September 11, 2008,  shows that there was some bilateral pneumonia.   HOSPITAL COURSE:  This is a 65 year old lady who was admitted with cough  and shortness of breath.  The patient is a  long-term tobacco smoker.  She had been coughing up brown, slightly red, rusty, mucous sputum.  At  presentation, she did have bilateral pneumonia with cavities with clear  underlying bronchiectasis.  She was tried on IV Rocephin and Zithromax.  She should be having acute renal failure which does improve with IV  fluid hydration, consult on tobacco cessation and placed on nicotine  patch.  Hyponatremia is improved with IV fluid hydration.  Pulmonologist  consulted and recommending, switching the antiobiotic with Avelox,  vancomycin, and Primaxin.  She did have a bronchoscopy on September 14, 2008, which mostly shows chronic bronchitis, no endobronchial lesion was  seen.  Pneumonia has been clinically improving.  However, the patient is  still dyspnea on mild exertion and noted to be having bilateral leg  swelling on September 19, 2008, and she has been sent for Doppler study to  rule out DVT.  The patient has been on SCDs for DVT prophylaxis, as  there was a fear of worsening hemoptysis because of the cavitary  pneumonia, and also query underlying renal artery thrombus.  We will  start on Lovenox or DVT prophylaxis, monitoring for hemoptysis and  worsening renal function.  We will also check  her D-dimer, cardiac BMP,  ABG, and 2-D echocardiogram today.  I also start the patient on Solu-  Medrol 60 mg IV q.8 h., monitoring CBC in the morning.  Continue with  Lasix for diuresis.  We will also benefit from having renal CT angiogram  of the renal arteries to rule out renal artery stenosis, when the  patient is medically more stable from the pulmonologist point of view.   Discharge medications will be dictated on final discharge.   Follow pulmonary recommendation.  Now, the patient do not intubate and  on limited code.  She does not want to be resuscitated if its futile.   PHYSICAL EXAMINATION:  GENERAL:  On examination today, she is a 65 year old lady not in acute respiratory distress.  VITAL  SIGNS:  Temperature is 98, pulse 96, respiratory rate 18, and  blood pressure 110/69.  HEENT:  Pupils are equal, reactive to light and accommodation.  Extraocular muscles are intact.  Heart sounds 1 and 2 regular.  NECK:  Supple.  No DVT or JVD.  She has bilateral coarse breath sounds  and no rhonchi.  ABDOMEN:  Benign.  NEUROLOGIC:  She is alert and oriented to time, place, and person.  No  gross neurological deficits.  Homans sign is negative.  There is no calf  tenderness.  Bilateral pedal edema.  Bell palsy is present.   LABORATORY DATA:  Labs today shows that the September 18, 2008, WBC of 10,  hematocrit 27, and platelet count 321.  Chemistry, sodium is 140,  potassium 4.0, chloride 99, bicarbonate 35, glucose 111, BUN 6,  creatinine 0.5.  Blood culture, no growth for the last 5 days and  respiratory culture showed GPC in pairs and clusters and few yeast  consistent with Candida species.      Herbie Saxon, MD  Electronically Signed     MIO/MEDQ  D:  09/19/2008  T:  09/20/2008  Job:  [id number]

## 2011-01-29 NOTE — Discharge Summary (Signed)
[name], [name]              ACCOUNT NO.:  [id number]   MEDICAL RECORD NO.:  [id number]          PATIENT TYPE:  INP   LOCATION:  3019                         FACILITY:  [facility]   PHYSICIAN:  Monte Fantasia, MD  DATE OF BIRTH:  Dec 25, 1945   DATE OF ADMISSION:  09/10/2008  DATE OF DISCHARGE:                               DISCHARGE SUMMARY   CONSULT:  Charlaine Dalton. Sherene Sires, MD, [facility], Pulmonary Critical Care.   DISCHARGE DIAGNOSES:  1. Necrotizing pneumonia.  2. Supratherapeutic INR which is resolved.  3. Right leg deep vein thrombosis.  4. Bilateral pleural effusion.  5. Acute on chronic bronchitis.  6. Chronic obstructive pulmonary disease exacerbation.  7. Chronic back pain.  8. Hypokalemia.  9. Hypophosphatemia.  10.Tobacco abuse.  11.Hypertension which is controlled.  12.Depression.  13.Acute on chronic respiratory failure which is improved.  14.Abdominal aortic atherosclerosis.   MEDICATIONS DURING THE STAY IN THE HOSPITAL:  1. Clindamycin 300 mg p.o. q.6 h.  2. Avelox 400 mg p.o. daily.  3. Diltiazem 60 mg p.o. b.i.d.  4. Advair 250/50 one puff inhalation b.i.d.  5. Spiriva 18 mcg p.o. daily.  6. Guaifenesin 1200 mg p.o. b.i.d.  7. Lasix 40 mg p.o. b.i.d.  8. Megace 400 mg p.o. daily.  9. Remeron 30 mg p.o. at bedtime.  10.Multivitamins 1 tablet p.o. daily.  11.Naprosyn 500 mg p.o. b.i.d.  12.Nicotine patch 21 mg to the chest wall daily.  13.Protonix 40 mg p.o. daily.  14.Trental 400 mg p.o. daily.  15.Potassium chloride 20 mEq p.o. daily.  16.Prednisone on a tapering dose of 20 mg p.o. daily on September 26, 2008, and then decrease it to 10 mg p.o. daily on September 28, 2008,      for 2 days, and then discontinue.  17.Trazodone 50 mg p.o. at bedtime.   COURSE DURING THE HOSPITAL STAY:  Please refer to the prior discharge  summary dictation on September 19, 2008, for the prior events.  This is a  dictation for the patient from September 21, 2008, to to date.  The  patient  is a 65 year old Caucasian lady patient, initially was admitted with  cough and shortness of breath.  The patient later on improved gradually  and was found to have necrotizing pneumonia.  As per the Pulmonary  followup, the patient had sputum cultures done which had no organisms  seen and the cultures had been no growth.  As per the Pulmonary, the  patient needed long-term antibiotics for at least 2-3 months.  The prior  antibiotics which the patient was on that is vancomycin, Primaxin, and  Avelox were changed to oral clindamycin and Avelox as per the Pulmonary  to continue the p.o. antibiotics for at least 2-3 months.  The patient  also was found later on to have right leg DVT and started on  anticoagulation.  During the stay in the hospital, the INR was  supratherapeutic and increased to 8.  All the Lovenox and Coumadin was  on hold for 3-4 days.  The patient also received vitamin K and FFP for  her  INR.  Now at present, the patient's INR decreased to 1.9 today.  As  per the Pharmacy recommendations, the patient is to receive her Coumadin  dose 3 mg today and will continue to monitor her INR.  The patient has  also had a GI evaluation in view of her guaiac-positive stools.  As per  the GI consult, the patient did not have any overt bleeding and needs  more time to recover from her critical illness.  It could be also  secondary to her supratherapeutic INR.  I recommended to continue her  anticoagulation with the Coumadin to maintain therapeutic INR, and if  the patient has any overt bleeding would be considered for a colonoscopy  or an EGD, or else the patient could be followed up with the GI as an  outpatient.  The patient symptomatically has been better today.  The  other issues with her is that the patient does not have insurance and  social work evaluation has been asked for in view of arranging for her  medications and to follow up with the primary care physician in view  of  the discharge planning.  If the INR is therapeutic, the patient is  stable to be discharged and needs to follow up as an outpatient.  We  will adjust the Coumadin dose and final discharge summary dictation and  medications will be dictated at the time of final discharge summary.   PHYSICAL EXAMINATION:  VITAL SIGNS:  Temperature of 98, pulse of 97,  respirations 18, blood pressure 130/66, and oxygen saturation 96% room  air.  HEENT:  Pupils equal, reacting to light.  No pallor.  No  lymphadenopathy.  NECK:  Supple.  RESPIRATORY:  Air entry is bilaterally equal.  No rales.  No rhonchi.  CARDIOVASCULAR:  S1 and S2 both heard.  Regular rate and rhythm.  ABDOMEN:  Soft.  No guarding.  No rigidity.  No tenderness.  EXTREMITIES:  Trace edema at the ankles of both feet.   LABORATORY STUDIES ON DISCHARGE:  Total WBC 17.1, hemoglobin 9.1,  hematocrit 28.0, and platelets of 316.  Prothrombin time 22.6, INR 1.9.  Fecal occult blood x2 has been positive.  No new radiological  investigations done after the prior discharge summary dictation.   DISPOSITION:  The patient is awaiting her INR to be therapeutic and we  will plan to discharge once therapeutic INR.      Monte Fantasia, MD  Electronically Signed     MP/MEDQ  D:  09/26/2008  T:  09/27/2008  Job:  [id number]

## 2011-06-21 LAB — BLOOD GAS, ARTERIAL
Bicarbonate: 26.9 mEq/L — ABNORMAL HIGH (ref 20.0–24.0)
Bicarbonate: 31 mEq/L — ABNORMAL HIGH (ref 20.0–24.0)
Drawn by: 257081
FIO2: 1 %
O2 Content: 3 L/min
O2 Saturation: 96.2 %
Patient temperature: 98.6
pCO2 arterial: 54.6 mmHg — ABNORMAL HIGH (ref 35.0–45.0)
pH, Arterial: 7.354 (ref 7.350–7.400)
pH, Arterial: 7.372 (ref 7.350–7.400)
pO2, Arterial: 289 mmHg — ABNORMAL HIGH (ref 80.0–100.0)
pO2, Arterial: 78.9 mmHg — ABNORMAL LOW (ref 80.0–100.0)

## 2011-06-21 LAB — POCT I-STAT 3, ART BLOOD GAS (G3+)
Bicarbonate: 20.6 mEq/L (ref 20.0–24.0)
Bicarbonate: 28.1 mEq/L — ABNORMAL HIGH (ref 20.0–24.0)
O2 Saturation: 98 %
Patient temperature: 98
TCO2: 30 mmol/L (ref 0–100)
pCO2 arterial: 37.1 mmHg (ref 35.0–45.0)
pCO2 arterial: 49.9 mmHg — ABNORMAL HIGH (ref 35.0–45.0)
pH, Arterial: 7.351 (ref 7.350–7.400)
pO2, Arterial: 113 mmHg — ABNORMAL HIGH (ref 80.0–100.0)

## 2011-06-21 LAB — FUNGUS CULTURE W SMEAR: Fungal Smear: NONE SEEN

## 2011-06-21 LAB — POCT I-STAT, CHEM 8
HCT: 48 % — ABNORMAL HIGH (ref 36.0–46.0)
Hemoglobin: 16.3 g/dL — ABNORMAL HIGH (ref 12.0–15.0)
Potassium: 4.1 mEq/L (ref 3.5–5.1)
Sodium: 117 mEq/L — CL (ref 135–145)
TCO2: 24 mmol/L (ref 0–100)

## 2011-06-21 LAB — LACTIC ACID, PLASMA: Lactic Acid, Venous: 4.2 mmol/L — ABNORMAL HIGH (ref 0.5–2.2)

## 2011-06-21 LAB — DIFFERENTIAL
Basophils Absolute: 0.3 10*3/uL — ABNORMAL HIGH (ref 0.0–0.1)
Lymphocytes Relative: 1 % — ABNORMAL LOW (ref 12–46)
Monocytes Relative: 2 % — ABNORMAL LOW (ref 3–12)
Neutrophils Relative %: 96 % — ABNORMAL HIGH (ref 43–77)

## 2011-06-21 LAB — BASIC METABOLIC PANEL
BUN: 13 mg/dL (ref 6–23)
BUN: 16 mg/dL (ref 6–23)
BUN: 25 mg/dL — ABNORMAL HIGH (ref 6–23)
Calcium: 7.6 mg/dL — ABNORMAL LOW (ref 8.4–10.5)
Calcium: 7.8 mg/dL — ABNORMAL LOW (ref 8.4–10.5)
Chloride: 100 mEq/L (ref 96–112)
Chloride: 106 mEq/L (ref 96–112)
GFR calc Af Amer: >60 mL/min (ref >60)
GFR calc Af Amer: >60 mL/min (ref >60)
GFR calc Af Amer: >60 mL/min (ref >60)
GFR calc non Af Amer: >60 mL/min (ref >60)
GFR calc non Af Amer: >60 mL/min (ref >60)
GFR calc non Af Amer: >60 mL/min (ref >60)
GFR calc non Af Amer: >60 mL/min (ref >60)
Glucose, Bld: 102 mg/dL — ABNORMAL HIGH (ref 70–99)
Glucose, Bld: 81 mg/dL (ref 70–99)
Glucose, Bld: 89 mg/dL (ref 70–99)
Potassium: 3.3 mEq/L — ABNORMAL LOW (ref 3.5–5.1)
Potassium: 3.3 mEq/L — ABNORMAL LOW (ref 3.5–5.1)
Potassium: 3.4 mEq/L — ABNORMAL LOW (ref 3.5–5.1)
Potassium: 3.6 mEq/L (ref 3.5–5.1)
Potassium: 3.6 mEq/L (ref 3.5–5.1)
Sodium: 135 mEq/L (ref 135–145)
Sodium: 138 mEq/L (ref 135–145)
Sodium: 139 mEq/L (ref 135–145)
Sodium: 139 mEq/L (ref 135–145)

## 2011-06-21 LAB — CULTURE, RESPIRATORY W GRAM STAIN: Culture: NORMAL

## 2011-06-21 LAB — CBC
HCT: 32 % — ABNORMAL LOW (ref 36.0–46.0)
HCT: 32.2 % — ABNORMAL LOW (ref 36.0–46.0)
HCT: 34.3 % — ABNORMAL LOW (ref 36.0–46.0)
HCT: 35 % — ABNORMAL LOW (ref 36.0–46.0)
HCT: 36.3 % (ref 36.0–46.0)
Hemoglobin: 10.7 g/dL — ABNORMAL LOW (ref 12.0–15.0)
Hemoglobin: 11 g/dL — ABNORMAL LOW (ref 12.0–15.0)
Hemoglobin: 11.5 g/dL — ABNORMAL LOW (ref 12.0–15.0)
Hemoglobin: 12.2 g/dL (ref 12.0–15.0)
MCHC: 32.8 g/dL (ref 30.0–36.0)
MCHC: 33.4 g/dL (ref 30.0–36.0)
MCHC: 34 g/dL (ref 30.0–36.0)
MCV: 93.8 fL (ref 78.0–100.0)
MCV: 94 fL (ref 78.0–100.0)
MCV: 94.6 fL (ref 78.0–100.0)
Platelets: 218 10*3/uL (ref 150–400)
Platelets: 326 10*3/uL (ref 150–400)
Platelets: 330 10*3/uL (ref 150–400)
Platelets: 373 10*3/uL (ref 150–400)
RBC: 3.46 MIL/uL — ABNORMAL LOW (ref 3.87–5.11)
RBC: 3.66 MIL/uL — ABNORMAL LOW (ref 3.87–5.11)
RBC: 4.7 MIL/uL (ref 3.87–5.11)
RDW: 13.3 % (ref 11.5–15.5)
RDW: 13.9 % (ref 11.5–15.5)
RDW: 14.4 % (ref 11.5–15.5)
WBC: 16.3 10*3/uL — ABNORMAL HIGH (ref 4.0–10.5)
WBC: 16.9 10*3/uL — ABNORMAL HIGH (ref 4.0–10.5)
WBC: 19.6 10*3/uL — ABNORMAL HIGH (ref 4.0–10.5)
WBC: 22 10*3/uL — ABNORMAL HIGH (ref 4.0–10.5)

## 2011-06-21 LAB — CARDIAC PANEL(CRET KIN+CKTOT+MB+TROPI)
Relative Index: INVALID (ref 0.0–2.5)
Total CK: 37 U/L (ref 7–177)

## 2011-06-21 LAB — TROPONIN I: Troponin I: 0.03 ng/mL (ref 0.00–0.06)

## 2011-06-21 LAB — URINALYSIS, ROUTINE W REFLEX MICROSCOPIC
Ketones, ur: NEGATIVE mg/dL
Nitrite: NEGATIVE
Protein, ur: NEGATIVE mg/dL
pH: 5 (ref 5.0–8.0)

## 2011-06-21 LAB — LIPID PANEL
HDL: 33 mg/dL — ABNORMAL LOW (ref >39)
Triglycerides: 26 mg/dL (ref <150)
VLDL: 5 mg/dL (ref 0–40)

## 2011-06-21 LAB — AFB CULTURE WITH SMEAR (NOT AT ARMC)

## 2011-06-21 LAB — MAGNESIUM
Magnesium: 1.5 mg/dL (ref 1.5–2.5)
Magnesium: 1.7 mg/dL (ref 1.5–2.5)

## 2011-06-21 LAB — COMPREHENSIVE METABOLIC PANEL
AST: 43 U/L — ABNORMAL HIGH (ref 0–37)
BUN: 67 mg/dL — ABNORMAL HIGH (ref 6–23)
CO2: 22 mEq/L (ref 19–32)
Calcium: 8 mg/dL — ABNORMAL LOW (ref 8.4–10.5)
Creatinine, Ser: 1.61 mg/dL — ABNORMAL HIGH (ref 0.4–1.2)
GFR calc Af Amer: 39 mL/min — ABNORMAL LOW (ref >60)
GFR calc non Af Amer: 33 mL/min — ABNORMAL LOW (ref >60)
Glucose, Bld: 117 mg/dL — ABNORMAL HIGH (ref 70–99)

## 2011-06-21 LAB — CK TOTAL AND CKMB (NOT AT ARMC)
CK, MB: 2.9 ng/mL (ref 0.3–4.0)
Relative Index: INVALID (ref 0.0–2.5)
Total CK: 35 U/L (ref 7–177)

## 2011-06-21 LAB — CULTURE, BLOOD (ROUTINE X 2)

## 2011-06-21 LAB — GIARDIA/CRYPTOSPORIDIUM SCREEN(EIA)

## 2011-06-21 LAB — PHOSPHORUS: Phosphorus: 3.6 mg/dL (ref 2.3–4.6)

## 2011-06-21 LAB — FECAL LACTOFERRIN, QUANT: Fecal Lactoferrin: POSITIVE

## 2011-06-21 LAB — EXPECTORATED SPUTUM ASSESSMENT W GRAM STAIN, RFLX TO RESP C

## 2011-06-21 LAB — TSH: TSH: 2.074 u[IU]/mL (ref 0.350–4.500)

## 2012-05-26 ENCOUNTER — Other Ambulatory Visit: Payer: Self-pay | Admitting: Family Medicine

## 2012-05-26 DIAGNOSIS — Z1231 Encounter for screening mammogram for malignant neoplasm of breast: Secondary | ICD-10-CM

## 2012-06-12 ENCOUNTER — Ambulatory Visit
Admission: RE | Admit: 2012-06-12 | Discharge: 2012-06-12 | Disposition: A | Discharge status: Home / Self Care | Payer: Medicare Other | Source: Ambulatory Visit | Attending: Family Medicine | Admitting: Family Medicine

## 2012-06-12 DIAGNOSIS — Z1231 Encounter for screening mammogram for malignant neoplasm of breast: Secondary | ICD-10-CM

## 2012-06-15 ENCOUNTER — Other Ambulatory Visit: Payer: Self-pay | Admitting: Family Medicine

## 2012-06-15 DIAGNOSIS — R928 Other abnormal and inconclusive findings on diagnostic imaging of breast: Secondary | ICD-10-CM

## 2012-06-17 ENCOUNTER — Ambulatory Visit
Admission: RE | Admit: 2012-06-17 | Discharge: 2012-06-17 | Disposition: A | Discharge status: Home / Self Care | Payer: Medicare Other | Source: Ambulatory Visit | Attending: Family Medicine | Admitting: Family Medicine

## 2012-06-17 DIAGNOSIS — R928 Other abnormal and inconclusive findings on diagnostic imaging of breast: Secondary | ICD-10-CM

## 2014-02-11 ENCOUNTER — Other Ambulatory Visit: Payer: Self-pay

## 2014-02-11 DIAGNOSIS — Z1231 Encounter for screening mammogram for malignant neoplasm of breast: Secondary | ICD-10-CM

## 2014-03-01 ENCOUNTER — Ambulatory Visit
Admission: RE | Admit: 2014-03-01 | Discharge: 2014-03-01 | Disposition: A | Discharge status: Home / Self Care | Payer: Medicare Other | Source: Ambulatory Visit

## 2014-03-01 DIAGNOSIS — Z1231 Encounter for screening mammogram for malignant neoplasm of breast: Secondary | ICD-10-CM

## 2014-03-24 ENCOUNTER — Encounter (HOSPITAL_COMMUNITY): Payer: Self-pay | Admitting: Emergency Medicine

## 2014-03-24 ENCOUNTER — Emergency Department (HOSPITAL_COMMUNITY): Payer: Medicare Other

## 2014-03-24 ENCOUNTER — Inpatient Hospital Stay (HOSPITAL_COMMUNITY)
Admission: EM | Admit: 2014-03-24 | Discharge: 2014-04-02 | DRG: 871 | Disposition: A | Discharge status: Skilled Nursing Facility | Payer: Medicare Other | Attending: Internal Medicine | Admitting: Internal Medicine

## 2014-03-24 DIAGNOSIS — D696 Thrombocytopenia, unspecified: Secondary | ICD-10-CM | POA: Diagnosis present

## 2014-03-24 DIAGNOSIS — A419 Sepsis, unspecified organism: Secondary | ICD-10-CM | POA: Diagnosis present

## 2014-03-24 DIAGNOSIS — I509 Heart failure, unspecified: Secondary | ICD-10-CM | POA: Diagnosis present

## 2014-03-24 DIAGNOSIS — E860 Dehydration: Secondary | ICD-10-CM | POA: Diagnosis present

## 2014-03-24 DIAGNOSIS — J441 Chronic obstructive pulmonary disease with (acute) exacerbation: Secondary | ICD-10-CM | POA: Diagnosis present

## 2014-03-24 DIAGNOSIS — R40244 Other coma, without documented Glasgow coma scale score, or with partial score reported, unspecified time: Secondary | ICD-10-CM

## 2014-03-24 DIAGNOSIS — I498 Other specified cardiac arrhythmias: Secondary | ICD-10-CM | POA: Diagnosis present

## 2014-03-24 DIAGNOSIS — J9600 Acute respiratory failure, unspecified whether with hypoxia or hypercapnia: Secondary | ICD-10-CM | POA: Diagnosis present

## 2014-03-24 DIAGNOSIS — N179 Acute kidney failure, unspecified: Secondary | ICD-10-CM | POA: Diagnosis present

## 2014-03-24 DIAGNOSIS — E875 Hyperkalemia: Secondary | ICD-10-CM | POA: Diagnosis present

## 2014-03-24 DIAGNOSIS — Z87891 Personal history of nicotine dependence: Secondary | ICD-10-CM

## 2014-03-24 DIAGNOSIS — G9340 Encephalopathy, unspecified: Secondary | ICD-10-CM | POA: Diagnosis present

## 2014-03-24 DIAGNOSIS — R7401 Elevation of levels of liver transaminase levels: Secondary | ICD-10-CM | POA: Diagnosis present

## 2014-03-24 DIAGNOSIS — R6520 Severe sepsis without septic shock: Secondary | ICD-10-CM | POA: Diagnosis present

## 2014-03-24 DIAGNOSIS — E8729 Other acidosis: Secondary | ICD-10-CM

## 2014-03-24 DIAGNOSIS — F29 Unspecified psychosis not due to a substance or known physiological condition: Secondary | ICD-10-CM | POA: Diagnosis not present

## 2014-03-24 DIAGNOSIS — E876 Hypokalemia: Secondary | ICD-10-CM | POA: Diagnosis present

## 2014-03-24 DIAGNOSIS — R21 Rash and other nonspecific skin eruption: Secondary | ICD-10-CM | POA: Diagnosis present

## 2014-03-24 DIAGNOSIS — J449 Chronic obstructive pulmonary disease, unspecified: Secondary | ICD-10-CM

## 2014-03-24 DIAGNOSIS — D72829 Elevated white blood cell count, unspecified: Secondary | ICD-10-CM | POA: Diagnosis present

## 2014-03-24 DIAGNOSIS — K7200 Acute and subacute hepatic failure without coma: Secondary | ICD-10-CM | POA: Diagnosis present

## 2014-03-24 DIAGNOSIS — F3289 Other specified depressive episodes: Secondary | ICD-10-CM | POA: Diagnosis present

## 2014-03-24 DIAGNOSIS — F329 Major depressive disorder, single episode, unspecified: Secondary | ICD-10-CM

## 2014-03-24 DIAGNOSIS — R4020 Unspecified coma: Secondary | ICD-10-CM

## 2014-03-24 DIAGNOSIS — E039 Hypothyroidism, unspecified: Secondary | ICD-10-CM | POA: Diagnosis present

## 2014-03-24 DIAGNOSIS — I10 Essential (primary) hypertension: Secondary | ICD-10-CM | POA: Diagnosis present

## 2014-03-24 DIAGNOSIS — J189 Pneumonia, unspecified organism: Secondary | ICD-10-CM | POA: Diagnosis present

## 2014-03-24 DIAGNOSIS — R7989 Other specified abnormal findings of blood chemistry: Secondary | ICD-10-CM

## 2014-03-24 DIAGNOSIS — I82629 Acute embolism and thrombosis of deep veins of unspecified upper extremity: Secondary | ICD-10-CM | POA: Diagnosis present

## 2014-03-24 DIAGNOSIS — I5031 Acute diastolic (congestive) heart failure: Secondary | ICD-10-CM | POA: Diagnosis present

## 2014-03-24 DIAGNOSIS — R778 Other specified abnormalities of plasma proteins: Secondary | ICD-10-CM

## 2014-03-24 DIAGNOSIS — R740 Nonspecific elevation of levels of transaminase and lactic acid dehydrogenase [LDH]: Secondary | ICD-10-CM

## 2014-03-24 DIAGNOSIS — R4182 Altered mental status, unspecified: Secondary | ICD-10-CM | POA: Diagnosis present

## 2014-03-24 DIAGNOSIS — G9341 Metabolic encephalopathy: Secondary | ICD-10-CM | POA: Diagnosis present

## 2014-03-24 DIAGNOSIS — K759 Inflammatory liver disease, unspecified: Secondary | ICD-10-CM

## 2014-03-24 DIAGNOSIS — Z79899 Other long term (current) drug therapy: Secondary | ICD-10-CM

## 2014-03-24 DIAGNOSIS — J9601 Acute respiratory failure with hypoxia: Secondary | ICD-10-CM

## 2014-03-24 DIAGNOSIS — K8020 Calculus of gallbladder without cholecystitis without obstruction: Secondary | ICD-10-CM | POA: Diagnosis present

## 2014-03-24 DIAGNOSIS — R40242 Glasgow coma scale score 9-12, unspecified time: Secondary | ICD-10-CM

## 2014-03-24 DIAGNOSIS — E872 Acidosis: Secondary | ICD-10-CM

## 2014-03-24 DIAGNOSIS — R7402 Elevation of levels of lactic acid dehydrogenase (LDH): Secondary | ICD-10-CM

## 2014-03-24 HISTORY — DX: Heart failure, unspecified: I50.9

## 2014-03-24 HISTORY — DX: Chronic obstructive pulmonary disease, unspecified: J44.9

## 2014-03-24 HISTORY — DX: Essential (primary) hypertension: I10

## 2014-03-24 LAB — URINALYSIS, ROUTINE W REFLEX MICROSCOPIC
Bilirubin Urine: NEGATIVE
GLUCOSE, UA: NEGATIVE mg/dL
GLUCOSE, UA: NEGATIVE mg/dL
KETONES UR: NEGATIVE mg/dL
Ketones, ur: 15 mg/dL — AB
Nitrite: NEGATIVE
Nitrite: NEGATIVE
PH: 5 (ref 5.0–8.0)
Protein, ur: 100 mg/dL — AB
Protein, ur: 100 mg/dL — AB
SPECIFIC GRAVITY, URINE: 1.02 (ref 1.005–1.030)
Specific Gravity, Urine: 1.015 (ref 1.005–1.030)
Urobilinogen, UA: 1 mg/dL (ref 0.0–1.0)
Urobilinogen, UA: 1 mg/dL (ref 0.0–1.0)
pH: 6 (ref 5.0–8.0)

## 2014-03-24 LAB — ACETAMINOPHEN LEVEL: Acetaminophen (Tylenol), Serum: <15 ug/mL (ref 10–30)

## 2014-03-24 LAB — CBC WITH DIFFERENTIAL/PLATELET
BLASTS: 0 %
Band Neutrophils: 53 % — ABNORMAL HIGH (ref 0–10)
Basophils Absolute: 0 10*3/uL (ref 0.0–0.1)
Basophils Absolute: 0 10*3/uL (ref 0.0–0.1)
Basophils Relative: 0 % (ref 0–1)
Basophils Relative: 0 % (ref 0–1)
Eosinophils Absolute: 0.1 10*3/uL (ref 0.0–0.7)
Eosinophils Absolute: 0 10*3/uL (ref 0.0–0.7)
Eosinophils Relative: 1 % (ref 0–5)
Eosinophils Relative: 0 % (ref 0–5)
HCT: 46.1 % — ABNORMAL HIGH (ref 36.0–46.0)
HCT: 29.6 % — ABNORMAL LOW (ref 36.0–46.0)
HCT: 43.6 % (ref 36.0–46.0)
Hemoglobin: 15.4 g/dL — ABNORMAL HIGH (ref 12.0–15.0)
Hemoglobin: 10 g/dL — ABNORMAL LOW (ref 12.0–15.0)
Hemoglobin: 14.9 g/dL (ref 12.0–15.0)
LYMPHS ABS: 1 10*3/uL (ref 0.7–4.0)
LYMPHS ABS: 1.4 10*3/uL (ref 0.7–4.0)
LYMPHS PCT: 14 % (ref 12–46)
Lymphocytes Relative: 9 % — ABNORMAL LOW (ref 12–46)
MCH: 30.9 pg (ref 26.0–34.0)
MCH: 31.4 pg (ref 26.0–34.0)
MCH: 31.2 pg (ref 26.0–34.0)
MCHC: 33.4 g/dL (ref 30.0–36.0)
MCHC: 33.8 g/dL (ref 30.0–36.0)
MCHC: 34.2 g/dL (ref 30.0–36.0)
MCV: 92.6 fL (ref 78.0–100.0)
MCV: 93.1 fL (ref 78.0–100.0)
MCV: 91.4 fL (ref 78.0–100.0)
MONOS PCT: 9 % (ref 3–12)
Metamyelocytes Relative: 3 %
Monocytes Absolute: 1 10*3/uL (ref 0.1–1.0)
Monocytes Absolute: 0.8 10*3/uL (ref 0.1–1.0)
Monocytes Relative: 8 % (ref 3–12)
Myelocytes: 13 %
NEUTROS ABS: 7.8 10*3/uL — AB (ref 1.7–7.7)
NEUTROS PCT: 81 % — AB (ref 43–77)
NEUTROS PCT: 9 % — AB (ref 43–77)
NRBC: 0 /100{WBCs}
Neutro Abs: 9.4 10*3/uL — ABNORMAL HIGH (ref 1.7–7.7)
PLATELETS: 81 10*3/uL — AB (ref 150–400)
PLATELETS: 53 10*3/uL — AB (ref 150–400)
PLATELETS: 74 10*3/uL — AB (ref 150–400)
PROMYELOCYTES ABS: 0 %
RBC: 4.98 MIL/uL (ref 3.87–5.11)
RBC: 3.18 MIL/uL — ABNORMAL LOW (ref 3.87–5.11)
RBC: 4.77 MIL/uL (ref 3.87–5.11)
RDW: 13 % (ref 11.5–15.5)
RDW: 12.8 % (ref 11.5–15.5)
RDW: 12.9 % (ref 11.5–15.5)
WBC: 11.5 10*3/uL — AB (ref 4.0–10.5)
WBC: 10 10*3/uL (ref 4.0–10.5)

## 2014-03-24 LAB — BLOOD GAS, ARTERIAL
Acid-base deficit: 0.2 mmol/L (ref 0.0–2.0)
Bicarbonate: 25.3 mEq/L — ABNORMAL HIGH (ref 20.0–24.0)
DRAWN BY: 252031
O2 CONTENT: 4 L/min
O2 Saturation: 92.2 %
PCO2 ART: 52 mmHg — AB (ref 35.0–45.0)
Patient temperature: 98.6
TCO2: 26.9 mmol/L (ref 0–100)
pH, Arterial: 7.309 — ABNORMAL LOW (ref 7.350–7.450)
pO2, Arterial: 79.3 mmHg — ABNORMAL LOW (ref 80.0–100.0)

## 2014-03-24 LAB — I-STAT ARTERIAL BLOOD GAS, ED
ACID-BASE EXCESS: 1 mmol/L (ref 0.0–2.0)
BICARBONATE: 28.3 meq/L — AB (ref 20.0–24.0)
O2 SAT: 85 %
TCO2: 30 mmol/L (ref 0–100)
pCO2 arterial: 53.7 mmHg — ABNORMAL HIGH (ref 35.0–45.0)
pH, Arterial: 7.329 — ABNORMAL LOW (ref 7.350–7.450)
pO2, Arterial: 54 mmHg — ABNORMAL LOW (ref 80.0–100.0)

## 2014-03-24 LAB — COMPREHENSIVE METABOLIC PANEL
ALT: 3083 U/L — ABNORMAL HIGH (ref 0–35)
ALT: 2408 U/L — ABNORMAL HIGH (ref 0–35)
ANION GAP: 22 — AB (ref 5–15)
AST: 2324 U/L — ABNORMAL HIGH (ref 0–37)
AST: 1488 U/L — AB (ref 0–37)
Albumin: 3.5 g/dL (ref 3.5–5.2)
Albumin: 3 g/dL — ABNORMAL LOW (ref 3.5–5.2)
Alkaline Phosphatase: 200 U/L — ABNORMAL HIGH (ref 39–117)
Alkaline Phosphatase: 161 U/L — ABNORMAL HIGH (ref 39–117)
Anion gap: 18 — ABNORMAL HIGH (ref 5–15)
BILIRUBIN TOTAL: 2 mg/dL — AB (ref 0.3–1.2)
BUN: 74 mg/dL — AB (ref 6–23)
BUN: 76 mg/dL — ABNORMAL HIGH (ref 6–23)
CALCIUM: 7.7 mg/dL — AB (ref 8.4–10.5)
CHLORIDE: 91 meq/L — AB (ref 96–112)
CO2: 23 mEq/L (ref 19–32)
CO2: 24 meq/L (ref 19–32)
Calcium: 8.1 mg/dL — ABNORMAL LOW (ref 8.4–10.5)
Chloride: 88 mEq/L — ABNORMAL LOW (ref 96–112)
Creatinine, Ser: 4.75 mg/dL — ABNORMAL HIGH (ref 0.50–1.10)
Creatinine, Ser: 4.23 mg/dL — ABNORMAL HIGH (ref 0.50–1.10)
GFR calc Af Amer: 12 mL/min — ABNORMAL LOW (ref >90)
GFR calc non Af Amer: 9 mL/min — ABNORMAL LOW (ref >90)
GFR calc non Af Amer: 10 mL/min — ABNORMAL LOW (ref >90)
GFR, EST AFRICAN AMERICAN: 10 mL/min — AB (ref >90)
GLUCOSE: 135 mg/dL — AB (ref 70–99)
Glucose, Bld: 112 mg/dL — ABNORMAL HIGH (ref 70–99)
POTASSIUM: 6.9 meq/L — AB (ref 3.7–5.3)
Potassium: 5.1 mEq/L (ref 3.7–5.3)
Sodium: 133 mEq/L — ABNORMAL LOW (ref 137–147)
Sodium: 133 mEq/L — ABNORMAL LOW (ref 137–147)
Total Bilirubin: 2.5 mg/dL — ABNORMAL HIGH (ref 0.3–1.2)
Total Protein: 7.4 g/dL (ref 6.0–8.3)
Total Protein: 6.6 g/dL (ref 6.0–8.3)

## 2014-03-24 LAB — CK: Total CK: 158 U/L (ref 7–177)

## 2014-03-24 LAB — I-STAT TROPONIN, ED: Troponin i, poc: 1.16 ng/mL (ref 0.00–0.08)

## 2014-03-24 LAB — URINE MICROSCOPIC-ADD ON

## 2014-03-24 LAB — PRO B NATRIURETIC PEPTIDE: Pro B Natriuretic peptide (BNP): 51342 pg/mL — ABNORMAL HIGH (ref 0–125)

## 2014-03-24 LAB — I-STAT CG4 LACTIC ACID, ED: LACTIC ACID, VENOUS: 1.92 mmol/L (ref 0.5–2.2)

## 2014-03-24 LAB — SALICYLATE LEVEL: Salicylate Lvl: <2 mg/dL — ABNORMAL LOW (ref 2.8–20.0)

## 2014-03-24 LAB — PROTIME-INR
INR: 1.78 — ABNORMAL HIGH (ref 0.00–1.49)
INR: 2.75 — ABNORMAL HIGH (ref 0.00–1.49)
PROTHROMBIN TIME: 20.7 s — AB (ref 11.6–15.2)
PROTHROMBIN TIME: 29.1 s — AB (ref 11.6–15.2)

## 2014-03-24 LAB — LACTIC ACID, PLASMA: Lactic Acid, Venous: 1.2 mmol/L (ref 0.5–2.2)

## 2014-03-24 LAB — BASIC METABOLIC PANEL
Anion gap: 19 — ABNORMAL HIGH (ref 5–15)
BUN: 75 mg/dL — AB (ref 6–23)
CO2: 23 mEq/L (ref 19–32)
Calcium: 7.7 mg/dL — ABNORMAL LOW (ref 8.4–10.5)
Chloride: 91 mEq/L — ABNORMAL LOW (ref 96–112)
Creatinine, Ser: 4.04 mg/dL — ABNORMAL HIGH (ref 0.50–1.10)
GFR calc Af Amer: 12 mL/min — ABNORMAL LOW (ref >90)
GFR calc non Af Amer: 11 mL/min — ABNORMAL LOW (ref >90)
GLUCOSE: 155 mg/dL — AB (ref 70–99)
POTASSIUM: 6 meq/L — AB (ref 3.7–5.3)
SODIUM: 133 meq/L — AB (ref 137–147)

## 2014-03-24 LAB — MRSA PCR SCREENING: MRSA BY PCR: NEGATIVE

## 2014-03-24 LAB — TYPE AND SCREEN
ABO/RH(D): B NEG
Antibody Screen: NEGATIVE

## 2014-03-24 LAB — AMMONIA: Ammonia: 51 umol/L (ref 11–60)

## 2014-03-24 LAB — PHOSPHORUS: Phosphorus: 1.4 mg/dL — ABNORMAL LOW (ref 2.3–4.6)

## 2014-03-24 LAB — GLUCOSE, CAPILLARY: GLUCOSE-CAPILLARY: 103 mg/dL — AB (ref 70–99)

## 2014-03-24 LAB — SODIUM, URINE, RANDOM: Sodium, Ur: 36 mEq/L

## 2014-03-24 LAB — ETHANOL: Alcohol, Ethyl (B): <11 mg/dL (ref 0–11)

## 2014-03-24 LAB — URIC ACID: Uric Acid, Serum: 5 mg/dL (ref 2.4–7.0)

## 2014-03-24 LAB — APTT
APTT: 33 s (ref 24–37)
aPTT: 27 seconds (ref 24–37)

## 2014-03-24 LAB — CREATININE, URINE, RANDOM: Creatinine, Urine: 76.21 mg/dL

## 2014-03-24 LAB — OSMOLALITY: OSMOLALITY: 292 mosm/kg (ref 275–300)

## 2014-03-24 MED ORDER — METRONIDAZOLE IN NACL 5-0.79 MG/ML-% IV SOLN
500.0000 mg | Freq: Three times a day (TID) | INTRAVENOUS | Status: DC
Start: 2014-03-24 — End: 2014-03-26
  Administered 2014-03-24 – 2014-03-26 (×6): 500 mg via INTRAVENOUS
  Filled 2014-03-24 (×7): qty 100

## 2014-03-24 MED ORDER — PANTOPRAZOLE SODIUM 40 MG IV SOLR
40.0000 mg | INTRAVENOUS | Status: DC
  Administered 2014-03-25: 40 mg via INTRAVENOUS
  Filled 2014-03-24 (×2): qty 40

## 2014-03-24 MED ORDER — ALBUTEROL SULFATE (2.5 MG/3ML) 0.083% IN NEBU: 2.5000 mg | INHALATION_SOLUTION | RESPIRATORY_TRACT | Status: DC | PRN

## 2014-03-24 MED ORDER — LACTULOSE ENEMA
300.0000 mL | Freq: Once | ORAL | Status: DC
  Filled 2014-03-24: qty 300

## 2014-03-24 MED ORDER — SODIUM CHLORIDE 0.9 % IV SOLN
1000.0000 mL | Freq: Once | INTRAVENOUS | Status: AC
  Administered 2014-03-24: 1000 mL via INTRAVENOUS

## 2014-03-24 MED ORDER — AZTREONAM 1 G IJ SOLR
500.0000 mg | Freq: Three times a day (TID) | INTRAMUSCULAR | Status: DC
  Administered 2014-03-24 – 2014-03-25 (×2): 500 mg via INTRAVENOUS
  Filled 2014-03-24 (×5): qty 0.5

## 2014-03-24 MED ORDER — SODIUM CHLORIDE 0.9 % IV SOLN: 1000.0000 mL | INTRAVENOUS | Status: DC

## 2014-03-24 MED ORDER — PANTOPRAZOLE SODIUM 40 MG IV SOLR
40.0000 mg | INTRAVENOUS | Status: DC
  Administered 2014-03-24: 40 mg via INTRAVENOUS
  Filled 2014-03-24: qty 40

## 2014-03-24 MED ORDER — SODIUM POLYSTYRENE SULFONATE 15 GM/60ML PO SUSP
30.0000 g | Freq: Once | ORAL | Status: AC
  Administered 2014-03-24: 30 g via ORAL
  Filled 2014-03-24 (×2): qty 120

## 2014-03-24 MED ORDER — VANCOMYCIN HCL IN DEXTROSE 1-5 GM/200ML-% IV SOLN
1000.0000 mg | Freq: Once | INTRAVENOUS | Status: AC
Start: 2014-03-24 — End: 2014-03-24
  Administered 2014-03-24: 1000 mg via INTRAVENOUS
  Filled 2014-03-24: qty 200

## 2014-03-24 MED ORDER — SODIUM CHLORIDE 0.9 % IV SOLN
INTRAVENOUS | Status: DC
  Administered 2014-03-24: 22:00:00 via INTRAVENOUS

## 2014-03-24 MED ORDER — IPRATROPIUM-ALBUTEROL 0.5-2.5 (3) MG/3ML IN SOLN
3.0000 mL | RESPIRATORY_TRACT | Status: DC
Start: 2014-03-24 — End: 2014-03-25
  Administered 2014-03-24 – 2014-03-25 (×3): 3 mL via RESPIRATORY_TRACT
  Filled 2014-03-24 (×4): qty 3

## 2014-03-24 NOTE — ED Notes (Addendum)
Pt taken to US by this RN.

## 2014-03-24 NOTE — ED Notes (Signed)
MD notified of Critical troponin of 1.16.

## 2014-03-24 NOTE — ED Notes (Signed)
Pts family reported that the pt was talking yesterday, pt not talking since this morning. Pt becoming increasingly disoriented.

## 2014-03-24 NOTE — ED Notes (Signed)
Patient transported to CT 

## 2014-03-24 NOTE — Progress Notes (Signed)
CRITICAL VALUE ALERT  Critical value received: POTASSIUM 6.9  Date of notification: 03/24/14  Time of notification: 2230  Critical value read back:YES  Nurse who received alert: Marigene EhlersYRIL Wilfrido Luedke RN  MD notified (1st page): DR Marchelle GearingAMASWAMY  Time of first page:  2230  MD notified (2nd page):  Time of second page:  Responding MD: DR Marchelle GearingAMASWAMY  Time MD responded: 2240

## 2014-03-24 NOTE — Progress Notes (Signed)
ANTIBIOTIC CONSULT NOTE - INITIAL  Pharmacy Consult for vancomycin, aztreonam Indication: pneumonia  Allergies  Allergen Reactions  . Codeine Hives  . Penicillins Other (See Comments)    Unknown     Patient Measurements:   Adjusted Body Weight:   Vital Signs: Temp: 98.1 F (36.7 C) (07/09 1447) Temp src: Rectal (07/09 1447) BP: 133/71 mmHg (07/09 1815) Pulse Rate: 92 (07/09 1815) Intake/Output from previous day:   Intake/Output from this shift:    Labs:  Recent Labs  03/24/14 1516  WBC 11.5*  HGB 15.4*  PLT 81*  CREATININE 4.75*    Microbiology: No results found for this or any previous visit (from the past 720 hour(s)).  Medical History: Past Medical History  Diagnosis Date  . COPD (chronic obstructive pulmonary disease)   . Hypertension   . CHF (congestive heart failure)     Medications:  See EMR  Assessment: 68 yo female presents with lethargy, altered mental status and diarrhea x 4 days.  Mild white count and left shift, afebrile, normotensive, lactic acid 1.9. Flagyl has been started due to history of c.diff and currently having diarrhea.  Initiating vanc/aztreonam for possible pneumonia vs fluid overload.  Current AKI without history of kidney disease, Scr 4.75, with last normal Scr in 2012.  No direct nephro-toxins listed in PTA med list   Goal of Therapy:  Vancomycin trough level 15-20 mcg/ml   Plan:  -Aztreonam 500 mg IV 18h -Vancomycin 1 g IV x1 -F/u c.diff, urine cx, blood cx -Monitor renal function -Re-order vancomycin based on kidney function   Agapito GamesAlison Rachell Druckenmiller, PharmD, BCPS Clinical Pharmacist Pager: [phone] 03/24/2014 6:38 PM

## 2014-03-24 NOTE — ED Notes (Signed)
RT note: Pt. unable to tolerate BiPAP@this  time, X3 attempts, pulls mask off, family@ bedside, replaced 2 lpm n/c, 96% sat, rr-20, RN/MD notified.

## 2014-03-24 NOTE — Progress Notes (Signed)
eLink Physician-Brief Progress Note Patient Name: [name] DOB: 1946-02-28 MRN: [id number]  Date of Service  03/24/2014   HPI/Events of Note    Recent Labs Lab 03/24/14 1516 03/24/14 2040  K 5.1 6.9*    Recent Labs Lab 03/24/14 1516 03/24/14 2040  CREATININE 4.75* 4.23*     Recent Labs Lab 03/24/14 1714  PROBNP 51342.0*   BNP very high and patient gettnig fluids but it appears so far she is handling volume resus well without resp distress and creat actually improving and she is making urine  Echo 2010 ws normal ef  BNP could be false high   eICU Interventions  Continue fluid resus for now Rx high k with kayexalate Recheck bmet in 2h Stat abg now   Intervention Category Intermediate Interventions: Electrolyte abnormality - evaluation and management  Lizzete Gough 03/24/2014, 10:39 PM

## 2014-03-24 NOTE — ED Provider Notes (Addendum)
CSN: [id number]     Arrival date & time 03/24/14  1428 History   First MD Initiated Contact with Patient 03/24/14 1433     Chief Complaint  Patient presents with  . Altered Mental Status   Level V caveat: Altered mental status HPI Patient presents to the emergency room for evaluation of confusion and lethargy. History is obtained from the patient's daughter. According to the daughter, the patient's had some trouble with cough and congestion the last few days. She's also had some diarrhea. He also noted that she had a nosebleed on Monday. The nosebleed and the diarrhea has not persisted but she has continued with the cough. The family thinks the patient called her primary Dr. and was put on some sort of cough medication. Last night she had some progression of her symptoms but this morning the patient has become progressively weak. She's not talking that much. She has not been able to get out of bed. Patient has spoken to the daughter with a few words this morning but she's not been any complete sentences. The patient has not had any specific complaints of pain. Past Medical History  Diagnosis Date  . COPD (chronic obstructive pulmonary disease)   . Hypertension   . CHF (congestive heart failure)    History reviewed. No pertinent past surgical history. No family history on file. History  Substance Use Topics  . Smoking status: Former Smoker    Quit date: 09/09/2009  . Smokeless tobacco: Not on file  . Alcohol Use: No   OB History   Grav Para Term Preterm Abortions TAB SAB Ect Mult Living                 Review of Systems  Unable to perform ROS: Mental status change      Allergies  Codeine and Penicillins  Home Medications   Prior to Admission medications   Medication Sig Start Date End Date Taking? Authorizing Provider  acetaminophen-codeine (TYLENOL #3) 300-30 MG per tablet Take 1-2 tablets by mouth every 4 (four) hours as needed (pain, coughing,diarrhea).   Yes Historical  Provider, MD  bisoprolol (ZEBETA) 10 MG tablet Take 10 mg by mouth daily.   Yes Historical Provider, MD  Fluticasone-Salmeterol (ADVAIR) 250-50 MCG/DOSE AEPB Inhale 1 puff into the lungs daily as needed (COPD).    Yes Historical Provider, MD  furosemide (LASIX) 40 MG tablet Take 40 mg by mouth 2 (two) times daily.   Yes Historical Provider, MD  levothyroxine (SYNTHROID, LEVOTHROID) 50 MCG tablet Take 50 mcg by mouth daily before breakfast.   Yes Historical Provider, MD  mirtazapine (REMERON) 30 MG tablet Take 30 mg by mouth at bedtime.   Yes Historical Provider, MD  pentoxifylline (TRENTAL) 400 MG CR tablet Take 400 mg by mouth daily.   Yes Historical Provider, MD  potassium chloride (K-DUR) 10 MEQ tablet Take 10 mEq by mouth daily.   Yes Historical Provider, MD  simvastatin (ZOCOR) 20 MG tablet Take 20 mg by mouth daily.   Yes Historical Provider, MD  VITAMIN D, CHOLECALCIFEROL, PO Take 1 tablet by mouth every 14 (fourteen) days.   Yes Historical Provider, MD   BP 131/68  Temp(Src) 98.1 F (36.7 C) (Rectal)  Resp 22  SpO2 98% Physical Exam  Nursing note and vitals reviewed. Constitutional: She appears well-nourished. She appears lethargic. No distress.  HENT:  Head: Normocephalic and atraumatic.  Right Ear: External ear normal.  Left Ear: External ear normal.  Mouth/Throat: No oropharyngeal exudate.  Eyes: Conjunctivae are normal. Pupils are equal, round, and reactive to light. Right eye exhibits no discharge. Left eye exhibits no discharge. No scleral icterus.  Neck: Neck supple. No tracheal deviation present.  Cardiovascular: Normal rate, regular rhythm and intact distal pulses.   Pulmonary/Chest: Effort normal and breath sounds normal. No stridor. No respiratory distress. She has no wheezes. She has no rales.  Frequent cough   Abdominal: Soft. Bowel sounds are normal. She exhibits no distension. There is no tenderness. There is no rebound and no guarding.  Musculoskeletal: She  exhibits no edema and no tenderness.  Neurological: She has normal strength. She appears lethargic. She displays no atrophy and no tremor. No cranial nerve deficit (no facial droop, ). She exhibits normal muscle tone. She displays no seizure activity. GCS eye subscore is 2. GCS verbal subscore is 3. GCS motor subscore is 5.  Pt does not follow commands, not responding to me when I am asking her questions, responds to painful stimuli  Skin: Skin is warm and dry. No rash noted. She is not diaphoretic.  Psychiatric: She has a normal mood and affect.    ED Course  Procedures (including critical care time) Mental state has been waxing and waning.  Pt pulled off the bipap mask.  Would not tolerate and keep the mask on.   1633  Vitals remains stable.  Oxygen mid 90s on 2 l [place].  CRITICAL CARE Performed by: AOZHY,QMVKNAPP,Jessey Stehlin Total critical care time: 40 Critical care time was exclusive of separately billable procedures and treating other patients. Critical care was necessary to treat or prevent imminent or life-threatening deterioration. Critical care was time spent personally by me on the following activities: development of treatment plan with patient and/or surrogate as well as nursing, discussions with consultants, evaluation of patient's response to treatment, examination of patient, obtaining history from patient or surrogate, ordering and performing treatments and interventions, ordering and review of laboratory studies, ordering and review of radiographic studies, pulse oximetry and re-evaluation of patient's condition.   Labs Review Labs Reviewed  URINALYSIS, ROUTINE W REFLEX MICROSCOPIC  APTT  CBC WITH DIFFERENTIAL  COMPREHENSIVE METABOLIC PANEL  PROTIME-INR  AMMONIA  I-STAT ARTERIAL BLOOD GAS, ED  TYPE AND SCREEN    Imaging Review No results found.   EKG Interpretation   Date/Time:  Thursday March 24 2014 14:45:52 EDT Ventricular Rate:  85 PR Interval:  166 QRS Duration: 94 QT  Interval:  395 QTC Calculation: 470 R Axis:   140 Text Interpretation:  Sinus rhythm Consider right atrial enlargement  Pattern suggests chronic pulmonary disease Baseline wander in lead(s) V2  qt interval increased since last tracing Confirmed by Eaven Schwager  MD-J, Marqueze Ramcharan  [id number]) on 03/24/2014 2:57:14 PM      MDM   Final diagnoses:  Glasgow coma scale total score 9-12  Hepatitis  Acute renal failure, unspecified acute renal failure type  Elevated troponin  Respiratory acidosis    Acidosis Pt has a mixed respiratory and metabolic acidosis associated with renal failure.  PH is increased to 42.  PH is 7.329.    Will monitor fluid intake.  Will need serial ABG.  Hepatitis LIver enzymes are significantly elevated.  AST and ALT are in the thousands with increased bili.   Ammonia level is pending.  Will obtain abd us.  Pt not a good candidate for CT at this time.  ?etiology.   Check for anatomic issues on US.  Will add on acetaminophen level.  No history of overdose. Anion  gap elevated at 22  AMS Likely related to her acidosis, renal failure and possible hepatic encephalopathy.  Elevated troponin Cannot exclude ACS however no EKG changes.  No chest pain history.  Will monitor.  Serial enzymes.  Linwood Dibbles, MD 03/24/14 1640  Linwood Dibbles, MD 03/24/14 [phone]

## 2014-03-24 NOTE — ED Notes (Signed)
Per EMS, pt had diarrhea since Monday with a nose bleed on Monday as well. Family notified PCP and pt was placed on codeine. Pt has become increasingly weak with decreased talking today. Pt was cool, diaphoretic and clammy upon EMS arrival.  SpO2: 93% on 4L [place].  BP: 131/66 HR: 86 CBG 176

## 2014-03-24 NOTE — H&P (Signed)
PULMONARY / CRITICAL CARE MEDICINE   Name: [name] MRN: [id number] DOB: 10/02/1945    ADMISSION DATE:  03/24/2014 CONSULTATION DATE:  03/24/2014  REFERRING MD :  EDP PRIMARY SERVICE: PCCM  CHIEF COMPLAINT:  AMS and ARF  BRIEF PATIENT DESCRIPTION: 68 year old female with history of CHF and COPD presenting via EMS after her daughter found her confused at home.  Upon EMS arrival patient was diaphoretic and clammy.  Evidently has had diarrhea that started 4 days ago, has not been eating or drinking but continued to take her medications including lasix and potassium.  Patient called PCP who ordered her codiene.  Did not relay that diarrhea was bloody and previous history of C. Diff but a pervious history of a similar incident a year ago when she had necrotizing pneumonia.  SIGNIFICANT EVENTS / STUDIES:  7/9 CT head >  No acute intracranial process.   LINES / TUBES: PIV  CULTURES: Blood 7/9>>> Urine 7/9>>> Sputum 7/9>>>  ANTIBIOTICS: Zosyn 7/9>>> Flagyl IV 7/9>>> Vancomycin 7/9>>>  PAST MEDICAL HISTORY :  Past Medical History  Diagnosis Date  . COPD (chronic obstructive pulmonary disease)   . Hypertension   . CHF (congestive heart failure)    History reviewed. No pertinent past surgical history. Prior to Admission medications   Medication Sig Start Date End Date Taking? Authorizing Provider  acetaminophen-codeine (TYLENOL #3) 300-30 MG per tablet Take 1-2 tablets by mouth every 4 (four) hours as needed (pain, coughing,diarrhea).   Yes Historical Provider, MD  bisoprolol (ZEBETA) 10 MG tablet Take 10 mg by mouth daily.   Yes Historical Provider, MD  Fluticasone-Salmeterol (ADVAIR) 250-50 MCG/DOSE AEPB Inhale 1 puff into the lungs daily as needed (COPD).    Yes Historical Provider, MD  furosemide (LASIX) 40 MG tablet Take 40 mg by mouth 2 (two) times daily.   Yes Historical Provider, MD  levothyroxine (SYNTHROID, LEVOTHROID) 50 MCG tablet Take 50 mcg by mouth daily before  breakfast.   Yes Historical Provider, MD  mirtazapine (REMERON) 30 MG tablet Take 30 mg by mouth at bedtime.   Yes Historical Provider, MD  pentoxifylline (TRENTAL) 400 MG CR tablet Take 400 mg by mouth daily.   Yes Historical Provider, MD  potassium chloride (K-DUR) 10 MEQ tablet Take 10 mEq by mouth daily.   Yes Historical Provider, MD  simvastatin (ZOCOR) 20 MG tablet Take 20 mg by mouth daily.   Yes Historical Provider, MD  VITAMIN D, CHOLECALCIFEROL, PO Take 1 tablet by mouth every 14 (fourteen) days.   Yes Historical Provider, MD   Allergies  Allergen Reactions  . Codeine Hives  . Penicillins Other (See Comments)    Unknown     FAMILY HISTORY:  No family history on file. SOCIAL HISTORY:  reports that she quit smoking about 4 years ago. She does not have any smokeless tobacco history on file. She reports that she does not drink alcohol or use illicit drugs.  REVIEW OF SYSTEMS:  Unattainable, patient is profoundly encephalopathic.  SUBJECTIVE:   VITAL SIGNS: Temp:  [98.1 F (36.7 C)] 98.1 F (36.7 C) (07/09 1447) Pulse Rate:  [82-86] 83 (07/09 1615) Resp:  [20-22] 20 (07/09 1615) BP: (124-137)/(62-68) 124/62 mmHg (07/09 1615) SpO2:  [93 %-98 %] 97 % (07/09 1615) HEMODYNAMICS:   VENTILATOR SETTINGS:   INTAKE / OUTPUT: Intake/Output   None     PHYSICAL EXAMINATION: General:  Chronically ill appearing female, groans to stimulation but non-verbal and not responsive. Neuro:  Groans to painful stimuli  and moving all ext to pain but nothing purposeful. HEENT:  [place]/AT, PERRL, EOM-I and dry mucous membranes. Cardiovascular:  RRR, Nl S1/S2, -M/R/G. Lungs:  Diffuse rales. Abdomen:  Soft, NT, ND and +BS. Musculoskeletal:  -edema and -tenderness. Skin:  Intact.  LABS:  CBC  Recent Labs Lab 03/24/14 1516  WBC 11.5*  HGB 15.4*  HCT 46.1*  PLT 81*   Coag's  Recent Labs Lab 03/24/14 1516  APTT 27  INR 1.78*   BMET  Recent Labs Lab 03/24/14 1516  NA 133*   K 5.1  CL 88*  CO2 23  BUN 74*  CREATININE 4.75*  GLUCOSE 135*   Electrolytes  Recent Labs Lab 03/24/14 1516  CALCIUM 8.1*   Sepsis Markers No results found for this basename: LATICACIDVEN, PROCALCITON, O2SATVEN,  in the last 168 hours ABG  Recent Labs Lab 03/24/14 1526  PHART 7.329*  PCO2ART 53.7*  PO2ART 54.0*   Liver Enzymes  Recent Labs Lab 03/24/14 1516  AST 2324*  ALT 3083*  ALKPHOS 200*  BILITOT 2.5*  ALBUMIN 3.5   Cardiac Enzymes No results found for this basename: TROPONINI, PROBNP,  in the last 168 hours Glucose No results found for this basename: GLUCAP,  in the last 168 hours  Imaging Ct Head Wo Contrast  03/24/2014   CLINICAL DATA:  Altered mental status  EXAM: CT HEAD WITHOUT CONTRAST  TECHNIQUE: Contiguous axial images were obtained from the base of the skull through the vertex without intravenous contrast.  COMPARISON:  None.  FINDINGS: Ventricle size is normal. Negative for acute or chronic infarction. Negative for hemorrhage or fluid collection. Negative for mass or edema. No shift of the midline structures.  Calvarium is intact.  IMPRESSION: Normal   Electronically Signed   By: Marlan Palau M.D.   On: 03/24/2014 16:59   Dg Chest Port 1 View  03/24/2014   CLINICAL DATA:  Altered mental status.  COPD and CHF  EXAM: PORTABLE CHEST - 1 VIEW  COMPARISON:  10/06/2009  FINDINGS: COPD. Diffusely increased lung markings in the upper and lower lobes, new since the prior study. This may represent pneumonia or possibly pulmonary edema. Small right pleural effusion  IMPRESSION: COPD. Bilateral airspace disease has developed since the prior study which may represent pneumonia or edema.   Electronically Signed   By: Marlan Palau M.D.   On: 03/24/2014 15:39     CXR: Pulmonary edema and hyperinflation.  ASSESSMENT / PLAN:  PULMONARY A: Pulmonary edema and concern for respiratory failure due to fluid overload.  She is currently able to protect her airway  but can easily be compromised.  P:   - D/C BiPAP. - Gentle volume. - Titrate O2 for sats. - Once more awake will need pulmonary hygienes. - Dubonebs - PRN albuterol.  CARDIOVASCULAR A: Sinus tachy and normotensive. P:  - Gentle volume. - Hold home anti-HTN. - 2D echo.  RENAL A:  ARF with mild hyperkalemia. P:   - Gentle volume. - BMET in AM. - No need for acute treatment for hyperkalemia. - Repeat BMET post hydration at 10 PM.  GASTROINTESTINAL A:  Diarrhea, ? Infectious.  Elevated LFTs. P:   - See ID section. - Stool O&P. - NPO incase has to be intubated. - ?C. Diff. - Lactulose - RUQ U/S. - Viral hepatitis panel.  HEMATOLOGIC A:  High WBC and low platelets. P:  - Monitor CBC.  INFECTIOUS A:  ? PNA, ?C diff. P:   - Stool c.diff - Vanc -  Zosyn - Flaygl  ENDOCRINE A:  Hypothyroid by history.   P:   - Synthroid 25 mg IV daily.  NEUROLOGIC A:  AMS likely due to dehydration and sepsis.  Ammonia and glucose are normal. P:   - Hold all sedating medications.  I have personally obtained a history, examined the patient, evaluated laboratory and imaging results, formulated the assessment and plan and placed orders.  CRITICAL CARE: The patient is critically ill with multiple organ systems failure and requires high complexity decision making for assessment and support, frequent evaluation and titration of therapies, application of advanced monitoring technologies and extensive interpretation of multiple databases. Critical Care Time devoted to patient care services described in this note is 45 minutes.   Alyson ReedyWesam G. Jhordan Mckibben, M.D. [facility]. Pager: [phone]. After hours pager: [phone].  03/24/2014, 5:02 PM

## 2014-03-24 NOTE — ED Notes (Signed)
MD at the bedside  

## 2014-03-24 NOTE — Consult Note (Signed)
Reason for Consult:AKI Referring Physician: Dr. Serita Kyle [name] is an 68 y.o. female.  HPI: 68 yr old female with hx of HTN, depression, COPD, ? CHF, presents now with lethagy, progressive over about 2 d.  Has cough and ? D earlier in week.  Not clear how much D,  But not severe D.  Has not felt well all week.  Had some med called in for cough and D by Dr. Arelia Sneddon..  No injury, has not c/o SOB.        No hx renal dz, denies NSAIDS via daughter.  No hx stones, or inherited renal disease.  No ACEI or ARB.   Review of systems not obtained due to patient factors.    Past Medical History  Diagnosis Date  . COPD (chronic obstructive pulmonary disease)   . Hypertension   . CHF (congestive heart failure)     History reviewed. No pertinent past surgical history.  No family history on file.  Social History:  reports that she quit smoking about 4 years ago. She does not have any smokeless tobacco history on file. She reports that she does not drink alcohol or use illicit drugs.  Allergies:  Allergies  Allergen Reactions  . Codeine Hives  . Penicillins Other (See Comments)    Unknown     Medications: I have reviewed the patient's current medications. Prior to Admission:  (Not in a hospital admission)   Results for orders placed during the hospital encounter of 03/24/14 (from the past 48 hour(s))  URINALYSIS, ROUTINE W REFLEX MICROSCOPIC     Status: Abnormal   Collection Time    03/24/14  2:45 PM      Result Value Ref Range   Color, Urine AMBER (*) YELLOW   Comment: BIOCHEMICALS MAY BE AFFECTED BY COLOR   APPearance CLOUDY (*) CLEAR   Specific Gravity, Urine 1.020  1.005 - 1.030   pH 5.0  5.0 - 8.0   Glucose, UA NEGATIVE  NEGATIVE mg/dL   Hgb urine dipstick LARGE (*) NEGATIVE   Bilirubin Urine SMALL (*) NEGATIVE   Ketones, ur 15 (*) NEGATIVE mg/dL   Protein, ur 100 (*) NEGATIVE mg/dL   Urobilinogen, UA 1.0  0.0 - 1.0 mg/dL   Nitrite NEGATIVE  NEGATIVE   Leukocytes, UA  TRACE (*) NEGATIVE  URINE MICROSCOPIC-ADD ON     Status: Abnormal   Collection Time    03/24/14  2:45 PM      Result Value Ref Range   Squamous Epithelial / LPF FEW (*) RARE   WBC, UA 0-2  <3 WBC/hpf   RBC / HPF 7-10  <3 RBC/hpf   Bacteria, UA FEW (*) RARE   Casts HYALINE CASTS (*) NEGATIVE   Urine-Other AMORPHOUS URATES/PHOSPHATES     Comment: RARE YEAST  APTT     Status: None   Collection Time    03/24/14  3:16 PM      Result Value Ref Range   aPTT 27  24 - 37 seconds  CBC WITH DIFFERENTIAL     Status: Abnormal   Collection Time    03/24/14  3:16 PM      Result Value Ref Range   WBC 11.5 (*) 4.0 - 10.5 K/uL   RBC 4.98  3.87 - 5.11 MIL/uL   Hemoglobin 15.4 (*) 12.0 - 15.0 g/dL   HCT 46.1 (*) 36.0 - 46.0 %   MCV 92.6  78.0 - 100.0 fL   MCH 30.9  26.0 - 34.0 pg  MCHC 33.4  30.0 - 36.0 g/dL   RDW 13.0  11.5 - 15.5 %   Platelets 81 (*) 150 - 400 K/uL   Comment: PLATELET COUNT CONFIRMED BY SMEAR     REPEATED TO VERIFY   Neutrophils Relative % 81 (*) 43 - 77 %   Lymphocytes Relative 9 (*) 12 - 46 %   Monocytes Relative 9  3 - 12 %   Eosinophils Relative 1  0 - 5 %   Basophils Relative 0  0 - 1 %   Neutro Abs 9.4 (*) 1.7 - 7.7 K/uL   Lymphs Abs 1.0  0.7 - 4.0 K/uL   Monocytes Absolute 1.0  0.1 - 1.0 K/uL   Eosinophils Absolute 0.1  0.0 - 0.7 K/uL   Basophils Absolute 0.0  0.0 - 0.1 K/uL   RBC Morphology POLYCHROMASIA PRESENT     WBC Morphology MILD LEFT SHIFT (1-5% METAS, OCC MYELO, OCC BANDS)     Comment: TOXIC GRANULATION     VACUOLATED NEUTROPHILS     ATYPICAL LYMPHOCYTES   Smear Review LARGE PLATELETS PRESENT    COMPREHENSIVE METABOLIC PANEL     Status: Abnormal   Collection Time    03/24/14  3:16 PM      Result Value Ref Range   Sodium 133 (*) 137 - 147 mEq/Taylor   Potassium 5.1  3.7 - 5.3 mEq/Taylor   Chloride 88 (*) 96 - 112 mEq/Taylor   CO2 23  19 - 32 mEq/Taylor   Glucose, Bld 135 (*) 70 - 99 mg/dL   BUN 74 (*) 6 - 23 mg/dL   Creatinine, Ser 4.75 (*) 0.50 - 1.10 mg/dL    Calcium 8.1 (*) 8.4 - 10.5 mg/dL   Total Protein 7.4  6.0 - 8.3 g/dL   Albumin 3.5  3.5 - 5.2 g/dL   AST 2324 (*) 0 - 37 U/Taylor   ALT 3083 (*) 0 - 35 U/Taylor   Alkaline Phosphatase 200 (*) 39 - 117 U/Taylor   Total Bilirubin 2.5 (*) 0.3 - 1.2 mg/dL   GFR calc non Af Amer 9 (*) >90 mL/min   GFR calc Af Amer 10 (*) >90 mL/min   Comment: (NOTE)     The eGFR has been calculated using the CKD EPI equation.     This calculation has not been validated in all clinical situations.     eGFR's persistently <90 mL/min signify possible Chronic Kidney     Disease.   Anion gap 22 (*) 5 - 15  PROTIME-INR     Status: Abnormal   Collection Time    03/24/14  3:16 PM      Result Value Ref Range   Prothrombin Time 20.7 (*) 11.6 - 15.2 seconds   INR 1.78 (*) 0.00 - 1.49  TYPE AND SCREEN     Status: None   Collection Time    03/24/14  3:16 PM      Result Value Ref Range   ABO/RH(D) B NEG     Antibody Screen NEG     Sample Expiration 03/27/2014    AMMONIA     Status: None   Collection Time    03/24/14  3:16 PM      Result Value Ref Range   Ammonia 51  11 - 60 umol/Taylor  I-STAT ARTERIAL BLOOD GAS, ED     Status: Abnormal   Collection Time    03/24/14  3:26 PM      Result Value Ref Range   pH,  Arterial 7.329 (*) 7.350 - 7.450   pCO2 arterial 53.7 (*) 35.0 - 45.0 mmHg   pO2, Arterial 54.0 (*) 80.0 - 100.0 mmHg   Bicarbonate 28.3 (*) 20.0 - 24.0 mEq/Taylor   TCO2 30  0 - 100 mmol/Taylor   O2 Saturation 85.0     Acid-Base Excess 1.0  0.0 - 2.0 mmol/Taylor   Patient temperature 98.6 F     Collection site RADIAL, ALLEN'S TEST ACCEPTABLE     Drawn by RT     Sample type ARTERIAL    I-STAT TROPOININ, ED     Status: Abnormal   Collection Time    03/24/14  3:41 PM      Result Value Ref Range   Troponin i, poc 1.16 (*) 0.00 - 0.08 ng/mL   Comment NOTIFIED PHYSICIAN     Comment 3            Comment: Due to the release kinetics of cTnI,     a negative result within the first hours     of the onset of symptoms does not rule out      myocardial infarction with certainty.     If myocardial infarction is still suspected,     repeat the test at appropriate intervals.  I-STAT CG4 LACTIC ACID, ED     Status: None   Collection Time    03/24/14  5:22 PM      Result Value Ref Range   Lactic Acid, Venous 1.92  0.5 - 2.2 mmol/Taylor    Ct Head Wo Contrast  03/24/2014   CLINICAL DATA:  Altered mental status  EXAM: CT HEAD WITHOUT CONTRAST  TECHNIQUE: Contiguous axial images were obtained from the base of the skull through the vertex without intravenous contrast.  COMPARISON:  None.  FINDINGS: Ventricle size is normal. Negative for acute or chronic infarction. Negative for hemorrhage or fluid collection. Negative for mass or edema. No shift of the midline structures.  Calvarium is intact.  IMPRESSION: Normal   Electronically Signed   By: Franchot Gallo M.D.   On: 03/24/2014 16:59   Dg Chest Port 1 View  03/24/2014   CLINICAL DATA:  Altered mental status.  COPD and CHF  EXAM: PORTABLE CHEST - 1 VIEW  COMPARISON:  10/06/2009  FINDINGS: COPD. Diffusely increased lung markings in the upper and lower lobes, new since the prior study. This may represent pneumonia or possibly pulmonary edema. Small right pleural effusion  IMPRESSION: COPD. Bilateral airspace disease has developed since the prior study which may represent pneumonia or edema.   Electronically Signed   By: Franchot Gallo M.D.   On: 03/24/2014 15:39    ROS Blood pressure 124/62, pulse 83, temperature 98.1 F (36.7 C), temperature source Rectal, resp. rate 20, SpO2 97.00%. Physical Exam Physical Examination: General appearance - responds to some questions, but stuporous.  And not all questions.  Appears acutely ill and moans at times Mental status - inappropriate safety awareness, as above Eyes - pupils equal and reactive, extraocular eye movements intact, funduscopic exam normal, discs flat and sharp Ears - bilateral TM's and external ear canals normal Mouth - coated tongue,  whitish to yellow also on buccal mucosa Neck - adenopathy noted PCL Lymphatics - posterior cervical nodes Chest - diminished bs, scattered rhonchi Heart - normal rate and regular rhythm, S1 and S2 normal, S4 present, systolic murmur holosys UU8/2 at apex Abdomen - soft, bs decreased, liver down 4 cm Musculoskeletal - no joint tenderness, deformity or swelling  Extremities - peripheral pulses normal, no pedal edema, no clubbing or cyanosis Skin - face plethoric, scattered telangectasia,   Assessment/Plan: 1 AKI suspect hypoperfusion vs toxic.  R/O rhabdo with large blood in urine.  ? Hepatorenal like syndrome drug induced or viral.  Not typical toxins as etio.  Vol ok. Severe lung dz.  AGMA with AKI,  R/O other source ie lactate/salicate, and will check S OSM 2 Acidosis not real severe and will be cautious with bicarb. 3 Hypertension: not an issue 4. COPD suspect fairly severe and may blow up pneu with vol 5. AMS suspect with liver/renal this is metabolic enceph 6 Depression P Cultures, Lactate,salicylate levels, ivf, U NA/Cr, Tylenol level, follow resp status closely, U/S  [name] 03/24/2014, 5:31 PM

## 2014-03-25 ENCOUNTER — Inpatient Hospital Stay (HOSPITAL_COMMUNITY): Payer: Medicare Other

## 2014-03-25 DIAGNOSIS — R7989 Other specified abnormal findings of blood chemistry: Secondary | ICD-10-CM

## 2014-03-25 DIAGNOSIS — I319 Disease of pericardium, unspecified: Secondary | ICD-10-CM

## 2014-03-25 LAB — COMPREHENSIVE METABOLIC PANEL
ALK PHOS: 177 U/L — AB (ref 39–117)
ALT: 2254 U/L — AB (ref 0–35)
AST: 1049 U/L — ABNORMAL HIGH (ref 0–37)
Albumin: 3.1 g/dL — ABNORMAL LOW (ref 3.5–5.2)
Anion gap: 22 — ABNORMAL HIGH (ref 5–15)
BUN: 71 mg/dL — ABNORMAL HIGH (ref 6–23)
CALCIUM: 8 mg/dL — AB (ref 8.4–10.5)
CO2: 23 mEq/L (ref 19–32)
Chloride: 94 mEq/L — ABNORMAL LOW (ref 96–112)
Creatinine, Ser: 3.52 mg/dL — ABNORMAL HIGH (ref 0.50–1.10)
GFR calc non Af Amer: 13 mL/min — ABNORMAL LOW (ref >90)
GFR, EST AFRICAN AMERICAN: 15 mL/min — AB (ref >90)
GLUCOSE: 128 mg/dL — AB (ref 70–99)
POTASSIUM: 3.9 meq/L (ref 3.7–5.3)
SODIUM: 139 meq/L (ref 137–147)
Total Bilirubin: 1.8 mg/dL — ABNORMAL HIGH (ref 0.3–1.2)
Total Protein: 6.6 g/dL (ref 6.0–8.3)

## 2014-03-25 LAB — GLUCOSE, CAPILLARY
GLUCOSE-CAPILLARY: 109 mg/dL — AB (ref 70–99)
GLUCOSE-CAPILLARY: 130 mg/dL — AB (ref 70–99)
GLUCOSE-CAPILLARY: 157 mg/dL — AB (ref 70–99)
GLUCOSE-CAPILLARY: 83 mg/dL (ref 70–99)
Glucose-Capillary: 124 mg/dL — ABNORMAL HIGH (ref 70–99)
Glucose-Capillary: 122 mg/dL — ABNORMAL HIGH (ref 70–99)

## 2014-03-25 LAB — HEPATITIS PANEL, ACUTE
HCV Ab: NEGATIVE
HEP B C IGM: NONREACTIVE
Hep A IgM: NONREACTIVE
Hepatitis B Surface Ag: NEGATIVE

## 2014-03-25 LAB — CORTISOL: Cortisol, Plasma: 26.1 ug/dL

## 2014-03-25 LAB — CBC
HCT: 43.1 % (ref 36.0–46.0)
Hemoglobin: 14.7 g/dL (ref 12.0–15.0)
MCH: 31.3 pg (ref 26.0–34.0)
MCHC: 34.1 g/dL (ref 30.0–36.0)
MCV: 91.7 fL (ref 78.0–100.0)
PLATELETS: 71 10*3/uL — AB (ref 150–400)
RBC: 4.7 MIL/uL (ref 3.87–5.11)
RDW: 13 % (ref 11.5–15.5)
WBC: 11 10*3/uL — ABNORMAL HIGH (ref 4.0–10.5)

## 2014-03-25 LAB — PATHOLOGIST SMEAR REVIEW

## 2014-03-25 LAB — HEPATITIS C ANTIBODY (REFLEX): HCV Ab: NEGATIVE

## 2014-03-25 LAB — CLOSTRIDIUM DIFFICILE BY PCR: CDIFFPCR: NEGATIVE

## 2014-03-25 LAB — MAGNESIUM: MAGNESIUM: 2.6 mg/dL — AB (ref 1.5–2.5)

## 2014-03-25 LAB — PARATHYROID HORMONE, INTACT (NO CA): PTH: 108.9 pg/mL — ABNORMAL HIGH (ref 14.0–72.0)

## 2014-03-25 LAB — PHOSPHORUS: PHOSPHORUS: 5 mg/dL — AB (ref 2.3–4.6)

## 2014-03-25 MED ORDER — VANCOMYCIN HCL IN DEXTROSE 1-5 GM/200ML-% IV SOLN: 1000.0000 mg | INTRAVENOUS | Status: DC

## 2014-03-25 MED ORDER — LEVOFLOXACIN IN D5W 750 MG/150ML IV SOLN
750.0000 mg | Freq: Once | INTRAVENOUS | Status: AC
  Administered 2014-03-25: 750 mg via INTRAVENOUS
  Filled 2014-03-25: qty 150

## 2014-03-25 MED ORDER — SODIUM CHLORIDE 0.9 % IV BOLUS (SEPSIS)
500.0000 mL | Freq: Once | INTRAVENOUS | Status: AC
  Administered 2014-03-25: 500 mL via INTRAVENOUS

## 2014-03-25 MED ORDER — DEXTROSE-NACL 5-0.45 % IV SOLN
INTRAVENOUS | Status: DC
  Administered 2014-03-25 – 2014-03-26 (×2): via INTRAVENOUS

## 2014-03-25 MED ORDER — IPRATROPIUM-ALBUTEROL 0.5-2.5 (3) MG/3ML IN SOLN
3.0000 mL | Freq: Four times a day (QID) | RESPIRATORY_TRACT | Status: DC
Start: 2014-03-25 — End: 2014-03-26
  Administered 2014-03-25 – 2014-03-26 (×4): 3 mL via RESPIRATORY_TRACT
  Filled 2014-03-25 (×4): qty 3

## 2014-03-25 MED ORDER — LEVOTHYROXINE SODIUM 50 MCG PO TABS
50.0000 ug | ORAL_TABLET | Freq: Every day | ORAL | Status: DC
  Filled 2014-03-25 (×2): qty 1

## 2014-03-25 MED ORDER — LEVOFLOXACIN IN D5W 500 MG/100ML IV SOLN: 500.0000 mg | INTRAVENOUS | Status: DC

## 2014-03-25 MED ORDER — LEVOTHYROXINE SODIUM 100 MCG IV SOLR
25.0000 ug | Freq: Every day | INTRAVENOUS | Status: DC
  Administered 2014-03-25 – 2014-03-26 (×2): 25 ug via INTRAVENOUS
  Filled 2014-03-25 (×2): qty 5

## 2014-03-25 NOTE — Progress Notes (Signed)
UR Completed.  Draydon [place] Jane [phone] 03/25/2014  

## 2014-03-25 NOTE — Progress Notes (Signed)
ANTIBIOTIC CONSULT NOTE - FOLLOW UP  Pharmacy Consult:  Vancomycin Indication:  PNA  Allergies  Allergen Reactions  . Codeine Hives  . Penicillins Other (See Comments)    Unknown     Patient Measurements: Height: 5\' 5"  (165.1 cm) Weight: 161 lb 9.6 oz (73.3 kg) IBW/kg (Calculated) : 57  Vital Signs: Temp: 97.8 F (36.6 C) (07/10 0351) Temp src: Oral (07/10 0351) BP: 110/57 mmHg (07/10 0700) Pulse Rate: 87 (07/10 0700) Intake/Output from previous day: 07/09 0701 - 07/10 0700 In: 1150 [I.V.:1000; IV Piggyback:150] Out: 1195 [Urine:1195]  Labs:  Recent Labs  03/24/14 1900 03/24/14 2040 03/24/14 2220 03/24/14 2231 03/25/14 0316  WBC QUESTIONABLE RESULTS, RECOMMEND RECOLLECT TO VERIFY 10.0  --   --  11.0*  HGB 10.0* 14.9  --   --  14.7  PLT 53* 74*  --   --  71*  LABCREA  --   --  76.21  --   --   CREATININE  --  4.23*  --  4.04* 3.52*   Estimated Creatinine Clearance: 15.8 ml/min (by C-G formula based on Cr of 3.52). No results found for this basename: VANCOTROUGH, Leodis BinetVANCOPEAK, VANCORANDOM, GENTTROUGH, GENTPEAK, GENTRANDOM, TOBRATROUGH, TOBRAPEAK, TOBRARND, AMIKACINPEAK, AMIKACINTROU, AMIKACIN,  in the last 72 hours   Microbiology: Recent Results (from the past 720 hour(s))  MRSA PCR SCREENING     Status: None   Collection Time    03/24/14  8:30 PM      Result Value Ref Range Status   MRSA by PCR NEGATIVE  NEGATIVE Final   Comment:            The GeneXpert MRSA Assay (FDA     approved for NASAL specimens     only), is one component of a     comprehensive MRSA colonization     surveillance program. It is not     intended to diagnose MRSA     infection nor to guide or     monitor treatment for     MRSA infections.  CLOSTRIDIUM DIFFICILE BY PCR     Status: None   Collection Time    03/25/14  2:29 AM      Result Value Ref Range Status   C difficile by pcr NEGATIVE  NEGATIVE Final      Assessment: [name] with history of necrotizing PNA started on  vancomycin, aztreonam and metronidazole for PNA.  Patient's renal function is improving with hydration.  Vanc 7/9 >> Azactam 7/9 >> Flagyl 7/9 >>  7/9 UCx -  7/9 BCx x2 - 7/10 C.diff - negative   Goal of Therapy:  Vancomycin trough level 15-20 mcg/ml   Plan:  - Vanc 1gm IV Q48H, start 03/26/14 - Azactam 500mg  IV Q8H - Flagyl 500mg  IV Q8H - Monitor renal fxn, clinical progress, vanc trough as indicated    Lathaniel Legate D. Laney Potashang, PharmD, BCPS Pager:  [phone] 03/25/2014, 8:35 AM

## 2014-03-25 NOTE — Progress Notes (Signed)
Patient ID: Shantale Holtmeyer, female   DOB: 06-19-1946, 68 y.o.   MRN: [id number]  [facility] Progress Note    Assessment/ Plan:   1. AKI: Non-oliguric, suspected to be from hemodynamically mediated pre-renal vs ATN injury--improving with IVFs and increased BP. Mental status still altered. Renal US without hydronephrosis. 2. AMS: appears metabolic encephalopathy from AKI/Acute? Liver injury vs sepsis of unclear source as yet (C diff testing negative). On empiric ABX for PNA. Hepatitis panel negative. 3. Metabolic acidosis: mild and improving with IVFs/renal improvement 4. COPD: currently without evidence of decompensation/acute flare 5. Hyperkalemia: corrected with SPE and now on IVFs   Subjective:   Hyperkalemic overnight- treated. No other acute events noted   Objective:   BP 110/57  Pulse 87  Temp(Src) 97.8 F (36.6 C) (Oral)  Resp 15  Ht 5\' 5"  (1.651 m)  Wt 73.3 kg (161 lb 9.6 oz)  BMI 26.89 kg/m2  SpO2 98%  Intake/Output Summary (Last 24 hours) at 03/25/14 0743 Last data filed at 03/25/14 0600  Gross per 24 hour  Intake   1150 ml  Output   1195 ml  Net    -45 ml   Weight change:   Physical Exam: NWG:NFAOZHYQMVH resting in bed QIO:NGEXB RRR, normal S1 and S2 Resp:Coarse BS with no rales/wheeze MWU:XLKG, obese, NT, BS normal Ext:No LE edema  Imaging: Ct Head Wo Contrast  03/24/2014   CLINICAL DATA:  Altered mental status  EXAM: CT HEAD WITHOUT CONTRAST  TECHNIQUE: Contiguous axial images were obtained from the base of the skull through the vertex without intravenous contrast.  COMPARISON:  None.  FINDINGS: Ventricle size is normal. Negative for acute or chronic infarction. Negative for hemorrhage or fluid collection. Negative for mass or edema. No shift of the midline structures.  Calvarium is intact.  IMPRESSION: Normal   Electronically Signed   By: Marlan Palau M.D.   On: 03/24/2014 16:59   US Abdomen Complete  03/24/2014   CLINICAL DATA:  Elevated  hepatic function studies; history of CHF and hypertension  EXAM: ULTRASOUND ABDOMEN COMPLETE  COMPARISON:  None.  FINDINGS: Gallbladder:  The gallbladder contains multiple echogenic stones. Mobility could not be assessed due to patient's today ste state. No positive sonographic Murphy's sign is reported but again the patient was sedated. There is no wall thickening or pericholecystic fluid.  Common bile duct:  Diameter: Mildly dilated at 7.9 mm.  Liver:  No focal lesion identified. Within normal limits in parenchymal echogenicity. There is no focal mass nor ductal dilation.  IVC:  No abnormality visualized.  Pancreas:  Evaluation of the pancreas is limited by bowel gas.  Spleen:  Size and appearance within normal limits.  Right Kidney:  Length: 9.1 cm. The renal echotexture is approximately equal to that of the adjacent liver. No mass or hydronephrosis visualized.  Left Kidney:  Length: 10.2. Echogenicity within normal limits. There is no hydronephrosis. There are echogenic foci in the lower pole which may reflect stones.  Abdominal aorta:  There is mural calcification with maximal diameter 2.6 cm.  Other findings:  No ascites  IMPRESSION: 1. The study is limited due to the patient's sedated status and body habitus. There are multiple gallstones without evidence of acute cholecystitis. The common bile duct is mildly dilated. 2. The renal cortical echotexture is mildly increased which likely reflects medical renal disease. There may be nonobstructing lower pole stones in the left kidney.   Electronically Signed   By: David  [place]   On:  03/24/2014 18:21   Dg Chest Port 1 View  03/25/2014   CLINICAL DATA:  COPD  EXAM: PORTABLE CHEST - 1 VIEW  COMPARISON:  03/24/2014  FINDINGS: Cardiac shadow is stable. The lungs remain hyperinflated. Multiple calcified granulomas as well as interstitial scarring is seen. Patchy infiltrate is noted in the right mid lung which may represent acute infiltrate. No acute bony abnormality  is noted.  IMPRESSION: No significant interval change from the prior exam. A portion of the changes bilaterally are chronic in nature.   Electronically Signed   By: Alcide CleverMark  Lukens M.D.   On: 03/25/2014 07:26   Dg Chest Port 1 View  03/24/2014   CLINICAL DATA:  Altered mental status.  COPD and CHF  EXAM: PORTABLE CHEST - 1 VIEW  COMPARISON:  10/06/2009  FINDINGS: COPD. Diffusely increased lung markings in the upper and lower lobes, new since the prior study. This may represent pneumonia or possibly pulmonary edema. Small right pleural effusion  IMPRESSION: COPD. Bilateral airspace disease has developed since the prior study which may represent pneumonia or edema.   Electronically Signed   By: Marlan Palauharles  Clark M.D.   On: 03/24/2014 15:39    Labs: BMET  Recent Labs Lab 03/24/14 1516 03/24/14 1810 03/24/14 2040 03/24/14 2231 03/25/14 0316  NA 133*  --  133* 133* 139  K 5.1  --  6.9* 6.0* 3.9  CL 88*  --  91* 91* 94*  CO2 23  --  24 23 23   GLUCOSE 135*  --  112* 155* 128*  BUN 74*  --  76* 75* 71*  CREATININE 4.75*  --  4.23* 4.04* 3.52*  CALCIUM 8.1*  --  7.7* 7.7* 8.0*  PHOS  --  1.4*  --   --  5.0*   CBC  Recent Labs Lab 03/24/14 1516 03/24/14 1900 03/24/14 2040 03/25/14 0316  WBC 11.5* QUESTIONABLE RESULTS, RECOMMEND RECOLLECT TO VERIFY 10.0 11.0*  NEUTROABS 9.4* QUESTIONABLE RESULTS, RECOMMEND RECOLLECT TO VERIFY 7.8*  --   HGB 15.4* 10.0* 14.9 14.7  HCT 46.1* 29.6* 43.6 43.1  MCV 92.6 93.1 91.4 91.7  PLT 81* 53* 74* 71*   Medications:    . aztreonam  500 mg Intravenous 3 times per day  . ipratropium-albuterol  3 mL Nebulization Q4H  . lactulose  300 mL Rectal Once  . metronidazole  500 mg Intravenous Q8H  . pantoprazole (PROTONIX) IV  40 mg Intravenous Q24H   Zetta BillsJay Marney Treloar, MD 03/25/2014, 7:43 AM

## 2014-03-25 NOTE — Progress Notes (Signed)
PULMONARY / CRITICAL CARE MEDICINE   Name: [name] MRN: [id number] DOB: 06/23/1946    ADMISSION DATE:  03/24/2014 CONSULTATION DATE:  03/24/2014  REFERRING MD :  EDP PRIMARY SERVICE: PCCM  CHIEF COMPLAINT:  AMS and ARF  BRIEF PATIENT DESCRIPTION: 68 year old female with history of CHF and COPD presenting via EMS after her daughter found her confused at home. Evidently has had diarrhea that started 4 days ago, has not been eating or drinking but continued to take her medications including lasix and potassium.  Previous history of C. Diff and a similar incident a year ago when she had necrotizing pneumonia.  SIGNIFICANT EVENTS / STUDIES:  7/9 CT head >  No acute intracranial process.   LINES / TUBES: PIV  CULTURES: Blood 7/9>>> Urine 7/9>>> Sputum 7/9>>> C diff PCR 7/10 >> NEG  ANTIBIOTICS: Zosyn 7/9>>> 7/10 Vancomycin 7/9>>> 7/10 Flagyl 7/9>>> Levaquin 7/10 >>  SUBJECTIVE:   VITAL SIGNS: Temp:  [97.1 F (36.2 C)-98.1 F (36.7 C)] 97.8 F (36.6 C) (07/10 0351) Pulse Rate:  [81-99] 87 (07/10 0700) Resp:  [13-33] 15 (07/10 0700) BP: (105-137)/(52-92) 110/57 mmHg (07/10 0700) SpO2:  [88 %-98 %] 88 % (07/10 0759) FiO2 (%):  [100 %] 100 % (07/09 2013) Weight:  [160 lb 7.9 oz (72.8 kg)-161 lb 9.6 oz (73.3 kg)] 161 lb 9.6 oz (73.3 kg) (07/10 0459)   INTAKE / OUTPUT: Intake/Output     07/09 0701 - 07/10 0700 07/10 0701 - 07/11 0700   I.V. (mL/kg) 1000 (13.6)    IV Piggyback 150    Total Intake(mL/kg) 1150 (15.7)    Urine (mL/kg/hr) 1195    Total Output 1195     Net -45          Stool Occurrence 4 x      PHYSICAL EXAMINATION: General:  Chronically ill appearing female. Oriented to person and place but not time. Neuro:  Groans to painful stimuli and moving all ext to pain but nothing purposeful. HEENT:  [place]/AT, dry mucous membranes. Cardiovascular:  RRR, Nl S1/S2, -M/R/G. Lungs:  Diffuse rales. Abdomen:  Soft, NT, ND and +BS. Skin:  Intact.  All new lab and  imaging results reviewed and relevant findings noted in assessment and plan.  CXR 7/9: COPD. Bilateral airspace disease has developed since the prior study which may represent pneumonia or edema.   ASSESSMENT / PLAN:  PULMONARY A: Pulmonary edema and concern for respiratory failure due to fluid overload and possible PNA. Emphysema and worsening of granulomatous changes on xray P:   - Titrate O2 for sats. - Once more awake will need pulmonary hygiene. - Duonebs - PRN albuterol. - Will not pursue work-up for malignancy at this time given acute illness  CARDIOVASCULAR A: Sinus tachy and normotensive. BNP 50K P:  - Gentle with volume. - Hold home anti-HTN. - 2D echo - read pending  RENAL A:  ARF with hyperkalemia. HyperK resolved s/p kayex 30g.  P:   - Gentle volume. - CMET in AM.  GASTROINTESTINAL A:  Diarrhea, ? Infectious. Fecal lactoferrin positive. Elevated LFTs - likely shock liver. Hep panel negative. Normal u/s of liver.  P:   - See ID section. - Stool culture pending - NPO until mental status clears - Monitor LFTs on am CMET and INR  HEMATOLOGIC A:  High WBC and low platelets. P:  - Monitor CBC.  INFECTIOUS A:  CXR stable. C diff negative by PCR. Possible PNA/COPD exac on CXR P:   - Antibiotics  and cultures as above for coverage of possible respiratory and GI infections  ENDOCRINE A:  Hypothyroid by history. Home synthroid 50mcg PO daily  P:   - Synthroid 25 mg IV daily.  NEUROLOGIC A:  AMS likely due to dehydration and sepsis.  Ammonia and glucose are normal. P:   - Hold all sedating medications. - Monitor for clearing   Beverely LowElena Adamo, MD, MPH [facility] PGY-2  PCCM ATTENDING: I have interviewed and examined the patient and reviewed the database. I have formulated the assessment and plan as reflected in the note above with amendments made by me.   Billy Fischeravid Simonds, MD;  PCCM service; Mobile [phone]  03/25/2014 8:14 AM

## 2014-03-25 NOTE — Progress Notes (Signed)
Echocardiogram 2D Echocardiogram has been performed.  Kazia Grisanti 03/25/2014, 10:00 AM

## 2014-03-25 NOTE — Progress Notes (Signed)
eLink Physician-Brief Progress Note Patient Name: [name] DOB: 07-03-1946 MRN: [id number]  Date of Service  03/25/2014   HPI/Events of Note   Hypotensive and oliguric.  eICU Interventions  Will give fluid bolus 500 ml NS.   Intervention Category Major Interventions: Other:  Hayden Mabin 03/25/2014, 6:37 PM

## 2014-03-26 ENCOUNTER — Inpatient Hospital Stay (HOSPITAL_COMMUNITY): Payer: Medicare Other

## 2014-03-26 DIAGNOSIS — G9340 Encephalopathy, unspecified: Secondary | ICD-10-CM | POA: Diagnosis present

## 2014-03-26 LAB — GLUCOSE, CAPILLARY
GLUCOSE-CAPILLARY: 102 mg/dL — AB (ref 70–99)
Glucose-Capillary: 105 mg/dL — ABNORMAL HIGH (ref 70–99)
Glucose-Capillary: 135 mg/dL — ABNORMAL HIGH (ref 70–99)
Glucose-Capillary: 143 mg/dL — ABNORMAL HIGH (ref 70–99)

## 2014-03-26 LAB — CBC
HEMATOCRIT: 41.4 % (ref 36.0–46.0)
Hemoglobin: 13.4 g/dL (ref 12.0–15.0)
MCH: 30.7 pg (ref 26.0–34.0)
MCHC: 32.4 g/dL (ref 30.0–36.0)
MCV: 94.7 fL (ref 78.0–100.0)
Platelets: 57 10*3/uL — ABNORMAL LOW (ref 150–400)
RBC: 4.37 MIL/uL (ref 3.87–5.11)
RDW: 13.4 % (ref 11.5–15.5)
WBC: 11 10*3/uL — ABNORMAL HIGH (ref 4.0–10.5)

## 2014-03-26 LAB — COMPREHENSIVE METABOLIC PANEL
ALBUMIN: 2.4 g/dL — AB (ref 3.5–5.2)
ALT: 1312 U/L — ABNORMAL HIGH (ref 0–35)
ANION GAP: 12 (ref 5–15)
AST: 337 U/L — ABNORMAL HIGH (ref 0–37)
Alkaline Phosphatase: 137 U/L — ABNORMAL HIGH (ref 39–117)
BILIRUBIN TOTAL: 1 mg/dL (ref 0.3–1.2)
BUN: 35 mg/dL — AB (ref 6–23)
CO2: 27 mEq/L (ref 19–32)
Calcium: 7.8 mg/dL — ABNORMAL LOW (ref 8.4–10.5)
Chloride: 106 mEq/L (ref 96–112)
Creatinine, Ser: 1.48 mg/dL — ABNORMAL HIGH (ref 0.50–1.10)
GFR calc Af Amer: 41 mL/min — ABNORMAL LOW (ref >90)
GFR calc non Af Amer: 36 mL/min — ABNORMAL LOW (ref >90)
Glucose, Bld: 148 mg/dL — ABNORMAL HIGH (ref 70–99)
Potassium: 2.8 mEq/L — CL (ref 3.7–5.3)
Sodium: 145 mEq/L (ref 137–147)
TOTAL PROTEIN: 5.4 g/dL — AB (ref 6.0–8.3)

## 2014-03-26 LAB — URINE CULTURE
Colony Count: NO GROWTH
Culture: NO GROWTH
SPECIAL REQUESTS: NORMAL

## 2014-03-26 LAB — URINE DRUGS OF ABUSE SCREEN W ALC, ROUTINE (REF LAB)
Amphetamine Screen, Ur: NEGATIVE
BARBITURATE QUANT UR: NEGATIVE
BENZODIAZEPINES.: NEGATIVE
CREATININE, U: 84.5 mg/dL
Cocaine Metabolites: NEGATIVE
Marijuana Metabolite: NEGATIVE
Methadone: NEGATIVE
OPIATE SCREEN, URINE: POSITIVE — AB
Phencyclidine (PCP): NEGATIVE
Propoxyphene: NEGATIVE

## 2014-03-26 LAB — PROTIME-INR
INR: 1.69 — AB (ref 0.00–1.49)
Prothrombin Time: 19.9 seconds — ABNORMAL HIGH (ref 11.6–15.2)

## 2014-03-26 LAB — TROPONIN I
TROPONIN I: 0.3 ng/mL — AB (ref <0.30)
Troponin I: 0.47 ng/mL (ref <0.30)

## 2014-03-26 MED ORDER — BISOPROLOL FUMARATE 5 MG PO TABS
5.0000 mg | ORAL_TABLET | Freq: Every day | ORAL | Status: DC
  Administered 2014-03-26 – 2014-04-02 (×8): 5 mg via ORAL
  Filled 2014-03-26 (×8): qty 1

## 2014-03-26 MED ORDER — CHLORHEXIDINE GLUCONATE 0.12 % MT SOLN
15.0000 mL | Freq: Two times a day (BID) | OROMUCOSAL | Status: DC
  Administered 2014-03-26 – 2014-04-02 (×13): 15 mL via OROMUCOSAL
  Filled 2014-03-26 (×17): qty 15

## 2014-03-26 MED ORDER — METRONIDAZOLE 500 MG PO TABS
500.0000 mg | ORAL_TABLET | Freq: Three times a day (TID) | ORAL | Status: DC
  Administered 2014-03-26 – 2014-03-29 (×9): 500 mg via ORAL
  Filled 2014-03-26 (×14): qty 1

## 2014-03-26 MED ORDER — LEVOFLOXACIN 250 MG PO TABS
250.0000 mg | ORAL_TABLET | Freq: Every day | ORAL | Status: DC
  Administered 2014-03-27: 250 mg via ORAL
  Filled 2014-03-26 (×2): qty 1

## 2014-03-26 MED ORDER — METOPROLOL TARTRATE 1 MG/ML IV SOLN
2.5000 mg | Freq: Two times a day (BID) | INTRAVENOUS | Status: DC
  Administered 2014-03-26: 2.5 mg via INTRAVENOUS
  Filled 2014-03-26 (×2): qty 5

## 2014-03-26 MED ORDER — LEVOFLOXACIN 500 MG PO TABS
500.0000 mg | ORAL_TABLET | Freq: Every day | ORAL | Status: AC
  Administered 2014-03-26: 500 mg via ORAL
  Filled 2014-03-26: qty 1

## 2014-03-26 MED ORDER — BIOTENE DRY MOUTH MT LIQD
15.0000 mL | Freq: Two times a day (BID) | OROMUCOSAL | Status: DC
  Administered 2014-03-26 – 2014-04-01 (×11): 15 mL via OROMUCOSAL

## 2014-03-26 MED ORDER — LEVOFLOXACIN 500 MG PO TABS: 500.0000 mg | ORAL_TABLET | Freq: Every day | ORAL | Status: DC

## 2014-03-26 MED ORDER — ARFORMOTEROL TARTRATE 15 MCG/2ML IN NEBU
15.0000 ug | INHALATION_SOLUTION | Freq: Two times a day (BID) | RESPIRATORY_TRACT | Status: DC
  Administered 2014-03-26 – 2014-03-27 (×2): 15 ug via RESPIRATORY_TRACT
  Filled 2014-03-26 (×4): qty 2

## 2014-03-26 MED ORDER — POTASSIUM CHLORIDE CRYS ER 20 MEQ PO TBCR
40.0000 meq | EXTENDED_RELEASE_TABLET | Freq: Two times a day (BID) | ORAL | Status: AC
  Administered 2014-03-26 (×2): 40 meq via ORAL
  Filled 2014-03-26 (×2): qty 2

## 2014-03-26 MED ORDER — ASPIRIN 300 MG RE SUPP
300.0000 mg | Freq: Every day | RECTAL | Status: DC
  Administered 2014-03-26: 300 mg via RECTAL
  Filled 2014-03-26: qty 1

## 2014-03-26 MED ORDER — POTASSIUM CHLORIDE 10 MEQ/100ML IV SOLN
10.0000 meq | INTRAVENOUS | Status: AC
  Administered 2014-03-26 (×6): 10 meq via INTRAVENOUS
  Filled 2014-03-26 (×6): qty 100

## 2014-03-26 MED ORDER — BIOTENE DRY MOUTH MT LIQD
15.0000 mL | Freq: Two times a day (BID) | OROMUCOSAL | Status: DC
  Administered 2014-03-26: 15 mL via OROMUCOSAL

## 2014-03-26 MED ORDER — ASPIRIN 81 MG PO CHEW
81.0000 mg | CHEWABLE_TABLET | Freq: Every day | ORAL | Status: DC
  Administered 2014-03-26 – 2014-04-02 (×8): 81 mg via ORAL
  Filled 2014-03-26 (×8): qty 1

## 2014-03-26 MED ORDER — DEXTROSE 5 % IV SOLN
INTRAVENOUS | Status: DC
  Administered 2014-03-26 – 2014-03-29 (×2): via INTRAVENOUS

## 2014-03-26 MED ORDER — BUDESONIDE 0.25 MG/2ML IN SUSP
0.5000 mg | Freq: Two times a day (BID) | RESPIRATORY_TRACT | Status: DC
  Administered 2014-03-26: 0.5 mg via RESPIRATORY_TRACT
  Filled 2014-03-26 (×5): qty 4

## 2014-03-26 MED ORDER — LEVOTHYROXINE SODIUM 50 MCG PO TABS
50.0000 ug | ORAL_TABLET | Freq: Every day | ORAL | Status: DC
  Administered 2014-03-27 – 2014-04-02 (×7): 50 ug via ORAL
  Filled 2014-03-26 (×9): qty 1

## 2014-03-26 NOTE — Progress Notes (Signed)
eLink Physician-Brief Progress Note Patient Name: [name] DOB: 1946-04-04 MRN: [id number]  Date of Service  03/26/2014   HPI/Events of Note  Hypokalemia with small elevation in trop of 0.47.   eICU Interventions  Plan: Replace potassium 2D Echo without contrast given renal insufficiency ASA now and daily Cycle trop Low dose BB Continue to monitor Consider cards consult   Intervention Category Intermediate Interventions: Electrolyte abnormality - evaluation and management Minor Interventions: Clinical assessment - ordering diagnostic tests  Dmitri Pettigrew 03/26/2014, 4:45 AM

## 2014-03-26 NOTE — Progress Notes (Signed)
PULMONARY / CRITICAL CARE MEDICINE   Name: [name] MRN: [id number] DOB: 09-14-1946    ADMISSION DATE:  03/24/2014 CONSULTATION DATE:  03/24/2014  REFERRING MD :  EDP PRIMARY SERVICE: PCCM  CHIEF COMPLAINT:  AMS and ARF  BRIEF PATIENT DESCRIPTION:  68 year old female with history of CHF and COPD presented to ED via EMS with AMS, 4 days of diarrhea and poor PO intake. Previous history of C Diff and a similar incident a year ago when she had necrotizing pneumonia.  SIGNIFICANT EVENTS / STUDIES:  7/9 CT head >  No acute intracranial process.  7/10 Echo: normal LVEF, no RWMAs, grade 1 diastolic dysfunction, moderate pulm htn not significantly changed from prior study  LINES / TUBES:   CULTURES: Urine 7/09 >> NEG C diff 7/10 >> NEG Stool 7/10 >> reincubated >>  Blood 7/09 >>    ANTIBIOTICS: Zosyn 7/9>>> 7/10 Vancomycin 7/9>>> 7/10 Flagyl 7/9>>> Levaquin 7/10 >>  SUBJECTIVE:  Much improved. More alert. Hypotension resolved. NAD. No new complaints. Desires a diet  VITAL SIGNS: Temp:  [97.6 F (36.4 C)-98.1 F (36.7 C)] 97.8 F (36.6 C) (07/11 1420) Pulse Rate:  [87-103] 87 (07/11 2113) Resp:  [12-42] 20 (07/11 2113) BP: (93-148)/(41-72) 148/72 mmHg (07/11 1420) SpO2:  [81 %-100 %] 97 % (07/11 2113)   INTAKE / OUTPUT: Intake/Output     07/11 0701 - 07/12 0700   I.V. (mL/kg) 938.3 (12.8)   IV Piggyback 600   Total Intake(mL/kg) 1538.3 (21)   Urine (mL/kg/hr) 220 (0.2)   Total Output 220   Net +1318.3         PHYSICAL EXAMINATION: General: NAD, RASS 0 Neuro: No focal deficits HEENT:  [place]/AT, WNL Cardiovascular:  RRR s M Lungs: no wheezes, R>L basilar crackles Abdomen:  Soft, NT, ND and +BS. Ext: no edema  All new lab and imaging results reviewed and relevant findings noted in assessment and plan.  CXR: increased RLL infiltrate vs atx   ASSESSMENT / PLAN:  PULMONARY A:  Severe emphysema Hx of necrotizing PNA Possible RLL PNA P:   Supplemental  O2 to maintain SpO2 > 92% Cont nebulized steroids and BDs  CARDIOVASCULAR A:  Sinus tach, resolved Hypotension, resolved Markedly elevated pro BNP without overt CHF Minimally elevated troponin I - doubt acute coronary syndrome Chronic beta blocker therapy P:  Transfer to telemetry Resume home Bisoprolol  RENAL A:   AKI, markedly improved Oliguria, resolved Hyperkalemia, resolved Hypokalemia P:   Monitor BMET intermittently Monitor I/Os Correct electrolytes as indicated  GASTROINTESTINAL A:  Diarrhea, ? Infectious. Fecal lactoferrin positive. Elevated LFTs - markedly improved. Acute viral panel negative  P:   SUP: N/I Begin CHO mod diet  HEMATOLOGIC A:   Thrombocytopenia P:  DVT px: SCDs Monitor CBC intermittently Transfuse per usual ICU guidelines Further eval if plt count does not improve  INFECTIOUS A:   Possible infectious diarrhea Possible RLL CAP P:   Micro and abx as above  ENDOCRINE A:   Hypothyroidism  P:   Change levothyroxine to home PO dose of 50 mcg daily  NEUROLOGIC A:   Acute encephalopathy - resolved P:   NO further eval  Transfer to tele. TRH to assume care as of AM 7/12 and PCCM to sign off. Discussed with Dr Nichola SizerMcClung   David Simonds, MD;  PCCM service; Mobile [phone]  03/26/2014 10:01 PM

## 2014-03-26 NOTE — Progress Notes (Signed)
Patient transferred to the unit from 45M. Placed on telemetry and 4L of O2. Oriented to the room.

## 2014-03-26 NOTE — Progress Notes (Signed)
Patient ID: [name], female   DOB: 11/20/1945, 68 y.o.   MRN: [id number]  [facility] Progress Note    Assessment/ Plan:   1. AKI: Non-oliguric, suspected to be from hemodynamically mediated pre-renal vs mild ATN injury--non-oliguric with continued improvement of renal function. No acute dialysis needs noted at this time. 2. AMS: appears metabolic encephalopathy from AKI/Acute? Liver injury vs sepsis of unclear source as yet (C diff testing negative). On empiric ABX for PNA. Hepatitis panel negative. On empiric treatment with levofloxacin/metronidazole 3. Hypokalemia: Undergoing intravenous potassium replacement at this time  4. COPD: currently without evidence of decompensation/acute flare. Noted to have worsening granulomatous changes on x-ray. 5. Hyperkalemia: corrected with SPE and now on IVFs   With improving renal function on intravenous fluids and no imminent dialysis needs, we'll sign off for now-please call if renal can assist further in her care  Subjective:   Denies any focal complaints-states that she had cough "no worse than usual" and had some diarrhea-unable to detail.    Objective:   BP 93/55  Pulse 92  Temp(Src) 97.8 F (36.6 C) (Oral)  Resp 20  Ht 5\' 5"  (1.651 m)  Wt 73.3 kg (161 lb 9.6 oz)  BMI 26.89 kg/m2  SpO2 98%  Intake/Output Summary (Last 24 hours) at 03/26/14 4098 Last data filed at 03/26/14 1191  Gross per 24 hour  Intake 2730.41 ml  Output   1450 ml  Net 1280.41 ml   Weight change:   Physical Exam: Gen: Somnolent but easy to awaken/engage in conversation CVS: Pulse regular in rate and rhythm, S1 and S2 normal Resp: Coarse breath sounds with intermittent expiratory wheeze otherwise clear Abd: Soft, obese, nontender and bowel sounds normal Ext: No lower extremity edema  Imaging: Ct Head Wo Contrast  03/24/2014   CLINICAL DATA:  Altered mental status  EXAM: CT HEAD WITHOUT CONTRAST  TECHNIQUE: Contiguous axial images were  obtained from the base of the skull through the vertex without intravenous contrast.  COMPARISON:  None.  FINDINGS: Ventricle size is normal. Negative for acute or chronic infarction. Negative for hemorrhage or fluid collection. Negative for mass or edema. No shift of the midline structures.  Calvarium is intact.  IMPRESSION: Normal   Electronically Signed   By: Marlan Palau M.D.   On: 03/24/2014 16:59   US Abdomen Complete  03/24/2014   CLINICAL DATA:  Elevated hepatic function studies; history of CHF and hypertension  EXAM: ULTRASOUND ABDOMEN COMPLETE  COMPARISON:  None.  FINDINGS: Gallbladder:  The gallbladder contains multiple echogenic stones. Mobility could not be assessed due to patient's today ste state. No positive sonographic Murphy's sign is reported but again the patient was sedated. There is no wall thickening or pericholecystic fluid.  Common bile duct:  Diameter: Mildly dilated at 7.9 mm.  Liver:  No focal lesion identified. Within normal limits in parenchymal echogenicity. There is no focal mass nor ductal dilation.  IVC:  No abnormality visualized.  Pancreas:  Evaluation of the pancreas is limited by bowel gas.  Spleen:  Size and appearance within normal limits.  Right Kidney:  Length: 9.1 cm. The renal echotexture is approximately equal to that of the adjacent liver. No mass or hydronephrosis visualized.  Left Kidney:  Length: 10.2. Echogenicity within normal limits. There is no hydronephrosis. There are echogenic foci in the lower pole which may reflect stones.  Abdominal aorta:  There is mural calcification with maximal diameter 2.6 cm.  Other findings:  No ascites  IMPRESSION:  1. The study is limited due to the patient's sedated status and body habitus. There are multiple gallstones without evidence of acute cholecystitis. The common bile duct is mildly dilated. 2. The renal cortical echotexture is mildly increased which likely reflects medical renal disease. There may be nonobstructing  lower pole stones in the left kidney.   Electronically Signed   By: David  SwazilandJordan   On: 03/24/2014 18:21   Dg Chest Port 1 View  03/26/2014   CLINICAL DATA:  Respiratory failure  EXAM: PORTABLE CHEST - 1 VIEW  COMPARISON:  03/25/2014  FINDINGS: The cardiac shadow is stable. Increasing right basilar atelectasis is noted. Diffuse interstitial changes are again noted. No bony abnormality is seen.  IMPRESSION: Increasing right basilar atelectasis.   Electronically Signed   By: Alcide CleverMark  Lukens M.D.   On: 03/26/2014 07:19   Dg Chest Port 1 View  03/25/2014   CLINICAL DATA:  COPD  EXAM: PORTABLE CHEST - 1 VIEW  COMPARISON:  03/24/2014  FINDINGS: Cardiac shadow is stable. The lungs remain hyperinflated. Multiple calcified granulomas as well as interstitial scarring is seen. Patchy infiltrate is noted in the right mid lung which may represent acute infiltrate. No acute bony abnormality is noted.  IMPRESSION: No significant interval change from the prior exam. A portion of the changes bilaterally are chronic in nature.   Electronically Signed   By: Alcide CleverMark  Lukens M.D.   On: 03/25/2014 07:26   Dg Chest Port 1 View  03/24/2014   CLINICAL DATA:  Altered mental status.  COPD and CHF  EXAM: PORTABLE CHEST - 1 VIEW  COMPARISON:  10/06/2009  FINDINGS: COPD. Diffusely increased lung markings in the upper and lower lobes, new since the prior study. This may represent pneumonia or possibly pulmonary edema. Small right pleural effusion  IMPRESSION: COPD. Bilateral airspace disease has developed since the prior study which may represent pneumonia or edema.   Electronically Signed   By: Marlan Palauharles  Clark M.D.   On: 03/24/2014 15:39    Labs: BMET  Recent Labs Lab 03/24/14 1516 03/24/14 1810 03/24/14 2040 03/24/14 2231 03/25/14 0316 03/26/14 0255  NA 133*  --  133* 133* 139 145  K 5.1  --  6.9* 6.0* 3.9 2.8*  CL 88*  --  91* 91* 94* 106  CO2 23  --  24 23 23 27   GLUCOSE 135*  --  112* 155* 128* 148*  BUN 74*  --  76* 75*  71* 35*  CREATININE 4.75*  --  4.23* 4.04* 3.52* 1.48*  CALCIUM 8.1*  --  7.7* 7.7* 8.0* 7.8*  PHOS  --  1.4*  --   --  5.0*  --    CBC  Recent Labs Lab 03/24/14 1516 03/24/14 1900 03/24/14 2040 03/25/14 0316 03/26/14 0255  WBC 11.5* QUESTIONABLE RESULTS, RECOMMEND RECOLLECT TO VERIFY 10.0 11.0* 11.0*  NEUTROABS 9.4* QUESTIONABLE RESULTS, RECOMMEND RECOLLECT TO VERIFY 7.8*  --   --   HGB 15.4* 10.0* 14.9 14.7 13.4  HCT 46.1* 29.6* 43.6 43.1 41.4  MCV 92.6 93.1 91.4 91.7 94.7  PLT 81* 53* 74* 71* 57*    Medications:    . aspirin  300 mg Rectal Daily  . ipratropium-albuterol  3 mL Nebulization Q6H  . [START ON 03/27/2014] levofloxacin (LEVAQUIN) IV  500 mg Intravenous Q48H  . levothyroxine  25 mcg Intravenous Daily  . metoprolol  2.5 mg Intravenous Q12H  . metronidazole  500 mg Intravenous Q8H  . pantoprazole (PROTONIX) IV  40 mg  Intravenous Q24H  . potassium chloride  10 mEq Intravenous Q1 Hr x 6   Zetta Bills, MD 03/26/2014, 7:38 AM

## 2014-03-26 NOTE — Progress Notes (Signed)
CRITICAL VALUE ALERT  Critical value received:  Troponin 0.47 , potassium 2.8  Date of notification: 03/26/14  Time of notification:  0400  Critical value read back: yes  Nurse who received alert: Marigene Ehlersyril Almalik Weissberg  MD notified (1st page):  Dr. Shelba Flakeeterding Elizabeth.  Time of first page:  0410  MD notified (2nd page):  Time of second page:  Responding MD: Dr Shelba Flakeeterding Elizabeth  Time MD responded: [phone]

## 2014-03-27 DIAGNOSIS — F3289 Other specified depressive episodes: Secondary | ICD-10-CM

## 2014-03-27 DIAGNOSIS — F329 Major depressive disorder, single episode, unspecified: Secondary | ICD-10-CM

## 2014-03-27 LAB — BASIC METABOLIC PANEL
Anion gap: 12 (ref 5–15)
BUN: 19 mg/dL (ref 6–23)
CALCIUM: 8.7 mg/dL (ref 8.4–10.5)
CO2: 25 mEq/L (ref 19–32)
CREATININE: 1.15 mg/dL — AB (ref 0.50–1.10)
Chloride: 101 mEq/L (ref 96–112)
GFR calc Af Amer: 56 mL/min — ABNORMAL LOW (ref >90)
GFR, EST NON AFRICAN AMERICAN: 48 mL/min — AB (ref >90)
GLUCOSE: 95 mg/dL (ref 70–99)
Potassium: 5 mEq/L (ref 3.7–5.3)
Sodium: 138 mEq/L (ref 137–147)

## 2014-03-27 LAB — CBC
HEMATOCRIT: 44.9 % (ref 36.0–46.0)
Hemoglobin: 14.8 g/dL (ref 12.0–15.0)
MCH: 31.2 pg (ref 26.0–34.0)
MCHC: 33 g/dL (ref 30.0–36.0)
MCV: 94.5 fL (ref 78.0–100.0)
Platelets: 65 10*3/uL — ABNORMAL LOW (ref 150–400)
RBC: 4.75 MIL/uL (ref 3.87–5.11)
RDW: 14.1 % (ref 11.5–15.5)
WBC: 26.1 10*3/uL — AB (ref 4.0–10.5)

## 2014-03-27 MED ORDER — IPRATROPIUM-ALBUTEROL 0.5-2.5 (3) MG/3ML IN SOLN
3.0000 mL | RESPIRATORY_TRACT | Status: DC | PRN
  Filled 2014-03-27: qty 3

## 2014-03-27 MED ORDER — VANCOMYCIN HCL 10 G IV SOLR
1250.0000 mg | INTRAVENOUS | Status: DC
  Administered 2014-03-27 – 2014-03-28 (×2): 1250 mg via INTRAVENOUS
  Filled 2014-03-27 (×3): qty 1250

## 2014-03-27 MED ORDER — IPRATROPIUM-ALBUTEROL 0.5-2.5 (3) MG/3ML IN SOLN
3.0000 mL | Freq: Four times a day (QID) | RESPIRATORY_TRACT | Status: DC
Start: 2014-03-27 — End: 2014-03-29
  Administered 2014-03-27 – 2014-03-29 (×9): 3 mL via RESPIRATORY_TRACT
  Filled 2014-03-27 (×9): qty 3

## 2014-03-27 MED ORDER — BUDESONIDE 0.5 MG/2ML IN SUSP
0.5000 mg | Freq: Two times a day (BID) | RESPIRATORY_TRACT | Status: DC
  Filled 2014-03-27 (×2): qty 2

## 2014-03-27 MED ORDER — METHYLPREDNISOLONE SODIUM SUCC 125 MG IJ SOLR
60.0000 mg | Freq: Four times a day (QID) | INTRAMUSCULAR | Status: DC
  Administered 2014-03-27 – 2014-03-30 (×12): 60 mg via INTRAVENOUS
  Filled 2014-03-27 (×16): qty 0.96

## 2014-03-27 MED ORDER — IPRATROPIUM-ALBUTEROL 0.5-2.5 (3) MG/3ML IN SOLN: 3.0000 mL | RESPIRATORY_TRACT | Status: DC | PRN

## 2014-03-27 MED ORDER — BUDESONIDE 0.5 MG/2ML IN SUSP
0.5000 mg | Freq: Two times a day (BID) | RESPIRATORY_TRACT | Status: DC
  Filled 2014-03-27 (×3): qty 2

## 2014-03-27 MED ORDER — IPRATROPIUM-ALBUTEROL 0.5-2.5 (3) MG/3ML IN SOLN
3.0000 mL | RESPIRATORY_TRACT | Status: DC
  Administered 2014-03-27: 3 mL via RESPIRATORY_TRACT
  Filled 2014-03-27: qty 3

## 2014-03-27 NOTE — Progress Notes (Signed)
ANTIBIOTIC CONSULT NOTE - INITIAL  Pharmacy Consult for vancomycin Indication: cellulitis  Allergies  Allergen Reactions  . Codeine Hives  . Penicillins Other (See Comments)    Unknown     Patient Measurements: Height: 5\' 5"  (165.1 cm) Weight: 161 lb 9.6 oz (73.3 kg) IBW/kg (Calculated) : 57  Vital Signs: Temp: 97.3 F (36.3 C) (07/12 2049) Temp src: Oral (07/12 2049) BP: 121/84 mmHg (07/12 2049) Pulse Rate: 98 (07/12 2049) Intake/Output from previous day: 07/11 0701 - 07/12 0700 In: 1538.3 [I.V.:938.3; IV Piggyback:600] Out: 220 [Urine:220] Intake/Output from this shift:    Labs:  Recent Labs  03/24/14 2220  03/25/14 0316 03/26/14 0255 03/27/14 1017  WBC  --   --  11.0* 11.0* 26.1*  HGB  --   --  14.7 13.4 14.8  PLT  --   --  71* 57* 65*  LABCREA 76.21  --   --   --   --   CREATININE  --   < > 3.52* 1.48* 1.15*  < > = values in this interval not displayed. Estimated Creatinine Clearance: 48.2 ml/min (by C-G formula based on Cr of 1.15). No results found for this basename: VANCOTROUGH, VANCOPEAK, VANCORANDOM, GENTTROUGH, GENTPEAK, GENTRANDOM, TOBRATROUGH, TOBRAPEAK, TOBRARND, AMIKACINPEAK, AMIKACINTROU, AMIKACIN,  in the last 72 hours   Microbiology: Recent Results (from the past 720 hour(s))  CULTURE, BLOOD (ROUTINE X 2)     Status: None   Collection Time    03/24/14  6:15 PM      Result Value Ref Range Status   Specimen Description BLOOD ARM RIGHT   Final   Special Requests BOTTLES DRAWN AEROBIC AND ANAEROBIC 10CC   Final   Culture  Setup Time     Final   Value: 03/25/2014 00:42     Performed at [facility]   Culture     Final   Value:        BLOOD CULTURE RECEIVED NO GROWTH TO DATE CULTURE WILL BE HELD FOR 5 DAYS BEFORE ISSUING A FINAL NEGATIVE REPORT     Performed at [facility]   Report Status PENDING   Incomplete  CULTURE, BLOOD (ROUTINE X 2)     Status: None   Collection Time    03/24/14  6:40 PM      Result Value Ref Range  Status   Specimen Description BLOOD ARM RIGHT   Final   Special Requests BOTTLES DRAWN AEROBIC AND ANAEROBIC 10CC   Final   Culture  Setup Time     Final   Value: 03/25/2014 00:42     Performed at [facility]   Culture     Final   Value:        BLOOD CULTURE RECEIVED NO GROWTH TO DATE CULTURE WILL BE HELD FOR 5 DAYS BEFORE ISSUING A FINAL NEGATIVE REPORT     Performed at [facility]   Report Status PENDING   Incomplete  MRSA PCR SCREENING     Status: None   Collection Time    03/24/14  8:30 PM      Result Value Ref Range Status   MRSA by PCR NEGATIVE  NEGATIVE Final   Comment:            The GeneXpert MRSA Assay (FDA     approved for NASAL specimens     only), is one component of a     comprehensive MRSA colonization     surveillance program. It is not  intended to diagnose MRSA     infection nor to guide or     monitor treatment for     MRSA infections.  URINE CULTURE     Status: None   Collection Time    03/24/14 10:16 PM      Result Value Ref Range Status   Specimen Description URINE, CATHETERIZED   Final   Special Requests Normal   Final   Culture  Setup Time     Final   Value: 03/25/2014 09:13     [facility]   Colony Count     Final   Value: NO GROWTH     [facility]   Culture     Final   Value: NO GROWTH     [facility] at [facility]   Report Status 03/26/2014 FINAL   Final  STOOL CULTURE     Status: None   Collection Time    03/25/14  2:29 AM      Result Value Ref Range Status   Specimen Description STOOL   Final   Special Requests Normal   Final   Culture     Final   Value: NO SUSPICIOUS COLONIES, CONTINUING TO HOLD     [facility]formed at [facility]   Report Status PENDING   Incomplete  CLOSTRIDIUM DIFFICILE BY PCR     Status: None   Collection Time    03/25/14  2:29 AM      Result Value Ref Range Status   C difficile by pcr NEGATIVE  NEGATIVE Final    Medical History: Past  Medical History  Diagnosis Date  . COPD (chronic obstructive pulmonary disease)   . Hypertension   . CHF (congestive heart failure)     Medications:  Scheduled:  . antiseptic oral rinse  15 mL Mouth Rinse q12n4p  . aspirin  81 mg Oral Daily  . bisoprolol  5 mg Oral Daily  . chlorhexidine  15 mL Mouth Rinse BID  . ipratropium-albuterol  3 mL Nebulization Q6H  . levofloxacin  250 mg Oral Daily  . levothyroxine  50 mcg Oral QAC breakfast  . methylPREDNISolone (SOLU-MEDROL) injection  60 mg Intravenous Q6H  . metroNIDAZOLE  500 mg Oral 3 times per day   Assessment: 68 y/o female with a hx necrotizing PNA who was started on vancomycin, aztreonam and metronidazole which was narrowed to Levaquin. Pharmacy now consulted to begin vancomycin for cellulitis.  Renal function has improved with SCr down to 1.15.   Vanc 7/9 >> 7/10; 7/12>> Azactam 7/9 >> 7/10 Flagyl 7/9 >> LVQ 7/10 >>  7/9 UCx - NG final 7/9 BCx x2 - NGTD 7/10 C.diff - negative 7/10 stool cx - reincubated  Goal of Therapy:  Vancomycin trough level 10-15 mcg/ml  Plan:  - Vancomycin 1250 mg IV q24h - Monitor renal function and clinical progress  University Of Utah HospitalJennifer , 17[address] Rainbow BoulevardPharm.D., BCPS Clinical Pharmacist Pager: [phone] 03/27/2014 9:15 PM

## 2014-03-27 NOTE — Evaluation (Signed)
Physical Therapy Evaluation Patient Details Name: [name] MRN: [id number] DOB: [birth year] Today's Date: 03/27/2014   History of Present Illness  Patient is a 68 yo female admitted 7 9/15 with AMS, weakness, diarrhea.  Patient with CAP, hypotension, ARF.  PMH: CHF, COPD, pna.  Clinical Impression  Patient presents with problems listed below.  Will benefit from acute PT to maximize independence prior to discharge.  Recommend HHPT for continued therapy at discharge.    Follow Up Recommendations Home health PT;Supervision/Assistance - 24 hour    Equipment Recommendations  None recommended by PT    Recommendations for Other Services       Precautions / Restrictions Precautions Precautions: Fall Restrictions Weight Bearing Restrictions: No      Mobility  Bed Mobility Overal bed mobility: Needs Assistance Bed Mobility: Supine to Sit;Sit to Supine     Supine to sit: Min guard;HOB elevated Sit to supine: Min assist;HOB elevated   General bed mobility comments: Verbal cues for sequencing.  Assist for safety.  HOB elevated.  Assist to bring LE's onto bed to return to supine  Transfers Overall transfer level: Needs assistance Equipment used: None Transfers: Sit to/from [employer] Sit to Stand: Min assist Stand pivot transfers: Min assist       General transfer comment: Verbal cues for hand placement and safety.  Assist for balance during transitions.  Patient able to take steps to pivot bed <> BSC.  Unsteady in standing and with transfers.   Ambulation/Gait                Stairs            Wheelchair Mobility    Modified Rankin (Stroke Patients Only)       Balance Overall balance assessment: Needs assistance         Standing balance support: Single extremity supported Standing balance-Leahy Scale: Poor Standing balance comment: Requires min assist for balance with static and dynamic balance.                            Pertinent Vitals/Pain     Home Living Family/patient expects to be discharged to:: Private residence Living Arrangements: Spouse/significant other Available Help at Discharge: Family;Available 24 hours/day Type of Home: House Home Access: Stairs to enter Entrance Stairs-Rails: None Entrance Stairs-Number of Steps: 1 Home Layout: One level Home Equipment: Walker - 2 wheels;Cane - single point;Bedside commode;Shower seat;Wheelchair - manual      Prior Function Level of Independence: Independent         Comments: Driving pta.     Hand Dominance        Extremity/Trunk Assessment   Upper Extremity Assessment: Generalized weakness           Lower Extremity Assessment: Generalized weakness (Noted red rash over lower body/legs)         Communication   Communication: No difficulties  Cognition Arousal/Alertness: Awake/alert Behavior During Therapy: Anxious Overall Cognitive Status: Within Functional Limits for tasks assessed                      General Comments General comments (skin integrity, edema, etc.): Patient with dyspnea 3/4 with mobility.  Patient breathing through mouth.  Instructed patient on pursed-lip breathing to utilize O2 (at 4 l/min via [place].)    Exercises        Assessment/Plan    PT Assessment Patient needs continued PT services  PT Diagnosis Difficulty  walking;Generalized weakness   PT Problem List Decreased strength;Decreased activity tolerance;Decreased balance;Decreased mobility;Decreased knowledge of use of DME;Cardiopulmonary status limiting activity;Obesity  PT Treatment Interventions DME instruction;Gait training;Functional mobility training;Therapeutic exercise;Balance training;Patient/family education   PT Goals (Current goals can be found in the Care Plan section) Acute Rehab PT Goals Patient Stated Goal: To feel better PT Goal Formulation: With patient Time For Goal Achievement: 04/03/14 Potential to Achieve  Goals: Good    Frequency Min 3X/week   Barriers to discharge        Co-evaluation               End of Session Equipment Utilized During Treatment: Gait belt;Oxygen Activity Tolerance: Patient limited by fatigue Patient left: in bed;with call bell/phone within reach;with bed alarm set Nurse Communication: Mobility status (Dyspnea)         Time: [id number] PT Time Calculation (min): 30 min   Charges:   PT Evaluation $Initial PT Evaluation Tier I: 1 Procedure PT Treatments $Therapeutic Activity: 23-37 mins   PT G Codes:          Vena AustriaDavis, Keir Viernes H 03/27/2014, 10:53 AM Durenda HurtSusan H. Renaldo Fiddleravis, PT, [facility] HospitalMBA Acute Rehab Services Pager [phone]

## 2014-03-27 NOTE — Progress Notes (Signed)
[facility] PROGRESS NOTE  [name] FTD:[id number] DOB: Oct 04, 1945 DOA: 03/24/2014 PCP: Leonard Downing, MD  Assessment/Plan: 1. RLL PNA 1. On levaquin 2. Afebrile 3. WBC up tp 26 from 11 4. Will repeat cdiff toxin - see below 2. COPD 1. Wheezing on exam 2. Will cont with duonebs 3. Cont IV steroids 3. HTN 1. BP stable 4. Acute renal failure 1. Renal function had been improving 2. Follow renal function 5. Diarrhea 1. Cdiff stool neg on 7/9 2. Will repeat cdiff stool toxin 6. Elevated LFT 1. Elevated alk phos with transaminitis 2. Multiple gallstones on abd [place] 3. LFT's trending down 7. Thrombocytopenia 1. Stable 8. Hypothyroid 1. On thyroid replacement 9. DVT prophylaxis 1. scd's  Code Status: Full Family Communication: Pt in room Disposition Plan: Pending   Consultants:  PCCM  Procedures:    Antibiotics: Zosyn 7/9>>> 7/10  Vancomycin 7/9>>> 7/10  Flagyl 7/9>>>  Levaquin 7/10 >>  HPI/Subjective: Still complain of wheezing and sob  Objective: Filed Vitals:   03/26/14 1420 03/26/14 2113 03/26/14 2234 03/27/14 0443  BP: 148/72  102/67 140/85  Pulse: 90 87 91 82  Temp: 97.8 F (36.6 C)  97.7 F (36.5 C) 97.6 F (36.4 C)  TempSrc: Oral  Oral Oral  Resp: _0 Height:      Weight:      SpO2: 94% 97% 96% 96%    Intake/Output Summary (Last 24 hours) at 03/27/14 1217 Last data filed at 03/27/14 0920  Gross per 24 hour  Intake 1251.66 ml  Output      0 ml  Net 1251.66 ml   Filed Weights   03/24/14 2000 03/25/14 0459  Weight: 160 lb 7.9 oz (72.8 kg) 161 lb 9.6 oz (73.3 kg)    Exam:   General:  Awake, in nad  Cardiovascular: regular, s1, s2  Respiratory: mildly increased resp effort, with end-expiratory wheezing  Abdomen: soft, nondistended  Musculoskeletal: perfused, no clubbing   Data Reviewed: Basic Metabolic Panel:  Recent Labs Lab 03/24/14 1516 03/24/14 1810 03/24/14 2040 03/24/14 2231  03/25/14 0316 03/26/14 0255  NA 133*  --  133* 133* 139 145  K 5.1  --  6.9* 6.0* 3.9 2.8*  CL 88*  --  91* 91* 94* 106  CO2 23  --  _1 GLUCOSE 135*  --  112* 155* 128* 148*  BUN 74*  --  76* 75* 71* 35*  CREATININE 4.75*  --  4.23* 4.04* 3.52* 1.48*  CALCIUM 8.1*  --  7.7* 7.7* 8.0* 7.8*  MG  --   --   --   --  2.6*  --   PHOS  --  1.4*  --   --  5.0*  --    Liver Function Tests:  Recent Labs Lab 03/24/14 1516 03/24/14 2040 03/25/14 0316 03/26/14 0255  AST 2324* 1488* 1049* 337*  ALT 3083* 2408* 2254* 1312*  ALKPHOS 200* 161* 177* 137*  BILITOT 2.5* 2.0* 1.8* 1.0  PROT 7.4 6.6 6.6 5.4*  ALBUMIN 3.5 3.0* 3.1* 2.4*   No results found for this basename: LIPASE, AMYLASE,  in the last 168 hours  Recent Labs Lab 03/24/14 1516  AMMONIA 51   CBC:  Recent Labs Lab 03/24/14 1516 03/24/14 1900 03/24/14 2040 03/25/14 0316 03/26/14 0255 03/27/14 1017  WBC 11.5* QUESTIONABLE RESULTS, RECOMMEND RECOLLECT TO VERIFY 10.0 11.0* 11.0* 26.1*  NEUTROABS 9.4* QUESTIONABLE RESULTS, RECOMMEND RECOLLECT TO VERIFY 7.8*  --   --   --  HGB 15.4* 10.0* 14.9 14.7 13.4 14.8  HCT 46.1* 29.6* 43.6 43.1 41.4 44.9  MCV 92.6 93.1 91.4 91.7 94.7 94.5  PLT 81* 53* 74* 71* 57* 65*   Cardiac Enzymes:  Recent Labs Lab 03/24/14 1810 03/26/14 0255 03/26/14 0855  CKTOTAL 158  --   --   TROPONINI  --  0.47* 0.30*   BNP (last 3 results)  Recent Labs  03/24/14 1714  PROBNP 51342.0*   CBG:  Recent Labs Lab 03/25/14 1952 03/26/14 0001 03/26/14 0400 03/26/14 0728 03/26/14 1154  GLUCAP 83 135* 143* 102* 105*    Recent Results (from the past 240 hour(s))  CULTURE, BLOOD (ROUTINE X 2)     Status: None   Collection Time    03/24/14  6:15 PM      Result Value Ref Range Status   Specimen Description BLOOD ARM RIGHT   Final   Special Requests BOTTLES DRAWN AEROBIC AND ANAEROBIC 10CC   Final   Culture  Setup Time     Final   Value: 03/25/2014 00:42     Performed at  [facility]   Culture     Final   Value:        BLOOD CULTURE RECEIVED NO GROWTH TO DATE CULTURE WILL BE HELD FOR 5 DAYS BEFORE ISSUING A FINAL NEGATIVE REPORT     Performed at [facility]   Report Status PENDING   Incomplete  CULTURE, BLOOD (ROUTINE X 2)     Status: None   Collection Time    03/24/14  6:40 PM      Result Value Ref Range Status   Specimen Description BLOOD ARM RIGHT   Final   Special Requests BOTTLES DRAWN AEROBIC AND ANAEROBIC 10CC   Final   Culture  Setup Time     Final   Value: 03/25/2014 00:42     Performed at [facility]   Culture     Final   Value:        BLOOD CULTURE RECEIVED NO GROWTH TO DATE CULTURE WILL BE HELD FOR 5 DAYS BEFORE ISSUING A FINAL NEGATIVE REPORT     Performed at [facility]   Report Status PENDING   Incomplete  MRSA PCR SCREENING     Status: None   Collection Time    03/24/14  8:30 PM      Result Value Ref Range Status   MRSA by PCR NEGATIVE  NEGATIVE Final   Comment:            The GeneXpert MRSA Assay (FDA     approved for NASAL specimens     only), is one component of a     comprehensive MRSA colonization     surveillance program. It is not     intended to diagnose MRSA     infection nor to guide or     monitor treatment for     MRSA infections.  URINE CULTURE     Status: None   Collection Time    03/24/14 10:16 PM      Result Value Ref Range Status   Specimen Description URINE, CATHETERIZED   Final   Special Requests Normal   Final   Culture  Setup Time     Final   Value: 03/25/2014 09:13     Performed at [place]     Final   Value: NO GROWTH     Performed at [facility]  Culture     Final   Value: NO GROWTH     Performed at [facility]   Report Status 03/26/2014 FINAL   Final  STOOL CULTURE     Status: None   Collection Time    03/25/14  2:29 AM      Result Value Ref Range Status   Specimen Description STOOL   Final   Special Requests  Normal   Final   Culture     Final   Value: Culture reincubated for better growth     Performed at [facility]   Report Status PENDING   Incomplete  CLOSTRIDIUM DIFFICILE BY PCR     Status: None   Collection Time    03/25/14  2:29 AM      Result Value Ref Range Status   C difficile by pcr NEGATIVE  NEGATIVE Final     Studies: Dg Chest Port 1 View  03/26/2014   CLINICAL DATA:  Respiratory failure  EXAM: PORTABLE CHEST - 1 VIEW  COMPARISON:  03/25/2014  FINDINGS: The cardiac shadow is stable. Increasing right basilar atelectasis is noted. Diffuse interstitial changes are again noted. No bony abnormality is seen.  IMPRESSION: Increasing right basilar atelectasis.   Electronically Signed   By: Inez Catalina M.D.   On: 03/26/2014 07:19    Scheduled Meds: . antiseptic oral rinse  15 mL Mouth Rinse q12n4p  . arformoterol  15 mcg Nebulization BID  . aspirin  81 mg Oral Daily  . bisoprolol  5 mg Oral Daily  . budesonide (PULMICORT) nebulizer solution  0.5 mg Nebulization BID  . chlorhexidine  15 mL Mouth Rinse BID  . levofloxacin  250 mg Oral Daily  . levothyroxine  50 mcg Oral QAC breakfast  . metroNIDAZOLE  500 mg Oral 3 times per day   Continuous Infusions: . dextrose 50 mL/hr at 03/26/14 1323    Active Problems:   Altered mental status   Transaminitis   Acute renal failure   Acute respiratory failure   Encephalopathy acute  Time spent: 65mn  [name], SElmwood ParkHospitalists Pager [phone] If 7PM-7AM, please contact night-coverage at [email or web address], password [email or web address]7/08/2014, 12:17 PM  LOS: 3 days

## 2014-03-27 NOTE — Progress Notes (Signed)
While assessing the patient, RN noticed she had a red rash from her legs to her buttocks. Pts daughter stated it wasn't there yesterday before 1800. Nurse tech from last night stated she noted some reddness on legs only. Pt without any complaints. Rash is red and warm to touch. On call MD notified, Pharmacy consult ordered. Vancomycin ordered and administered. Will continue to monitor.

## 2014-03-28 DIAGNOSIS — I10 Essential (primary) hypertension: Secondary | ICD-10-CM

## 2014-03-28 LAB — BASIC METABOLIC PANEL
Anion gap: 13 (ref 5–15)
BUN: 24 mg/dL — ABNORMAL HIGH (ref 6–23)
CO2: 23 mEq/L (ref 19–32)
CREATININE: 1.03 mg/dL (ref 0.50–1.10)
Calcium: 8.8 mg/dL (ref 8.4–10.5)
Chloride: 102 mEq/L (ref 96–112)
GFR calc Af Amer: 64 mL/min — ABNORMAL LOW (ref >90)
GFR calc non Af Amer: 55 mL/min — ABNORMAL LOW (ref >90)
GLUCOSE: 141 mg/dL — AB (ref 70–99)
POTASSIUM: 5.1 meq/L (ref 3.7–5.3)
Sodium: 138 mEq/L (ref 137–147)

## 2014-03-28 LAB — CBC
HCT: 45.9 % (ref 36.0–46.0)
HEMOGLOBIN: 14.8 g/dL (ref 12.0–15.0)
MCH: 30.6 pg (ref 26.0–34.0)
MCHC: 32.2 g/dL (ref 30.0–36.0)
MCV: 94.8 fL (ref 78.0–100.0)
Platelets: 78 10*3/uL — ABNORMAL LOW (ref 150–400)
RBC: 4.84 MIL/uL (ref 3.87–5.11)
RDW: 14.6 % (ref 11.5–15.5)
WBC: 29.2 10*3/uL — ABNORMAL HIGH (ref 4.0–10.5)

## 2014-03-28 LAB — STOOL CULTURE: SPECIAL REQUESTS: NORMAL

## 2014-03-28 LAB — CLOSTRIDIUM DIFFICILE BY PCR: Toxigenic C. Difficile by PCR: NEGATIVE

## 2014-03-28 MED ORDER — LEVOFLOXACIN 750 MG PO TABS
750.0000 mg | ORAL_TABLET | ORAL | Status: DC
  Administered 2014-03-28 – 2014-03-29 (×2): 750 mg via ORAL
  Filled 2014-03-28 (×4): qty 1

## 2014-03-28 MED ORDER — LEVOFLOXACIN 750 MG PO TABS: 750.0000 mg | ORAL_TABLET | ORAL | Status: DC

## 2014-03-28 MED ORDER — DIPHENHYDRAMINE HCL 50 MG/ML IJ SOLN
25.0000 mg | Freq: Once | INTRAMUSCULAR | Status: AC
  Administered 2014-03-28: 25 mg via INTRAVENOUS
  Filled 2014-03-28: qty 1

## 2014-03-28 MED ORDER — FUROSEMIDE 10 MG/ML IJ SOLN
40.0000 mg | Freq: Every day | INTRAMUSCULAR | Status: DC
  Administered 2014-03-28 – 2014-03-29 (×2): 40 mg via INTRAVENOUS
  Filled 2014-03-28 (×2): qty 4

## 2014-03-28 NOTE — Progress Notes (Signed)
CARE MANAGEMENT NOTE 03/28/2014  Patient:  [name],[name]   Account Number:  [id number]  Date Initiated:  03/25/2014  Documentation initiated by:  [facility]OWN,[name]  Subjective/Objective Assessment:   Admitted with AMS - diarrhea - dehydration     Action/Plan:   pt eval- rec hhpt   Anticipated DC Date:  03/28/2014   Anticipated DC Plan:  HOME W HOME HEALTH SERVICES      DC Planning Services  CM consult      Choice offered to / List presented to:             Status of service:  In process, will continue to follow Medicare Important Message given?  YES (If response is "NO", the following Medicare IM given date fields will be blank) Date Medicare IM given:  03/28/2014 Medicare IM given by:  [name],Shania Bjelland Date Additional Medicare IM given:   Additional Medicare IM given by:    Discharge Disposition:    Per UR Regulation:  Reviewed for med. necessity/level of care/duration of stay  If discussed at Long Length of Stay Meetings, dates discussed:    Comments:  ContactJess Barters:   Bullard,[name] Mother [phone]                  Cox,[name] Sister [phone]

## 2014-03-28 NOTE — Progress Notes (Signed)
Physical Therapy Treatment Patient Details Name: [name] MRN: [id number] DOB: 03-05-1946 Today's Date: 03/28/2014    History of Present Illness Patient is a 68 yo female admitted 7 9/15 with AMS, weakness, diarrhea.  Patient with CAP, hypotension, ARF.  PMH: CHF, COPD, pna.    PT Comments    Pt progressing with mobility and able to ambulate today. Pt with sats 93% on 4L at rest with drop to 80% with gait, on 6L maintaining only 85% with pursed lip breathing and slow gait. Pt continues to need cues for activity, transfers and gait and will continue to follow. Pt with bil UE edema with wedding band noted to be tight on finger with RN made aware.   Follow Up Recommendations  Home health PT;Supervision/Assistance - 24 hour     Equipment Recommendations       Recommendations for Other Services       Precautions / Restrictions Precautions Precautions: Fall Precaution Comments: watch sats Restrictions Weight Bearing Restrictions: No    Mobility  Bed Mobility         Supine to sit: Min guard;HOB elevated     General bed mobility comments: cues for sequence with increased time and use of rail  Transfers Overall transfer level: Needs assistance   Transfers: Sit to/from Stand Sit to Stand: Min guard         General transfer comment: cues for hand placement and safety  Ambulation/Gait Ambulation/Gait assistance: Min guard Ambulation Distance (Feet): 120 Feet Assistive device: Rolling walker (2 wheeled) Gait Pattern/deviations: Step-through pattern;Decreased stride length;Trunk flexed   Gait velocity interpretation: Below normal speed for age/gender General Gait Details: cues for posture and position in RW as well as pursed lip breathing with standing rest halfway through ambulation   Stairs            Wheelchair Mobility    Modified Rankin (Stroke Patients Only)       Balance Overall balance assessment: Needs assistance   Sitting balance-Leahy  Scale: Good       Standing balance-Leahy Scale: Fair                      Cognition Arousal/Alertness: Awake/alert Behavior During Therapy: Flat affect Overall Cognitive Status: Impaired/Different from baseline Area of Impairment: Safety/judgement         Safety/Judgement: Decreased awareness of deficits;Decreased awareness of safety          Exercises      General Comments        Pertinent Vitals/Pain No pain    Home Living                      Prior Function            PT Goals (current goals can now be found in the care plan section) Progress towards PT goals: Progressing toward goals    Frequency       PT Plan Current plan remains appropriate    Co-evaluation             End of Session Equipment Utilized During Treatment: Gait belt;Oxygen Activity Tolerance: Patient tolerated treatment well Patient left: in chair;with call bell/phone within reach;with chair alarm set     Time: [id number] PT Time Calculation (min): 27 min  Charges:  $Gait Training: 8-22 mins $Therapeutic Activity: 8-22 mins                    G Codes:  Toney Sang Beth 03/28/2014, 10:55 AM Delaney Meigs, PT [phone]

## 2014-03-28 NOTE — Progress Notes (Signed)
SATURATION QUALIFICATIONS: (This note is used to comply with regulatory documentation for home oxygen) Patient Saturations on Room Air= N/A Patient Saturations on 4L at Rest = 93%  Patient Saturations on 4L while Ambulating = 80%  Patient Saturations on 6 Liters of oxygen while Ambulating = 85%  Please briefly explain why patient needs home oxygen: Pt with continued desaturation with activity on supplemental oxygen Lindsay MeigsMaija Taylor [name], PT [phone]

## 2014-03-28 NOTE — Progress Notes (Signed)
[facility] PROGRESS NOTE  [name] PXT:[id number] DOB: 27-Mar-1946 DOA: 03/24/2014 PCP: Leonard Downing, MD  Assessment/Plan: 1. RLL PNA 1. On levaquin 2. Afebrile 3. WBC up to 29 from 26, however pt is now on IV steroids (see below) 4. Will repeat cdiff toxin - see below 2. COPD 1. Decreased bs, wheezing improved 2. Cont with duonebs 3. Cont IV steroids 4. Pt still O2 dependent, normally does not require O2 3. HTN 1. BP stable 4. Acute renal failure 1. Renal function improving 2. Follow renal function 5. Diarrhea 1. Cdiff stool neg on 7/9 2. Will repeat cdiff stool toxin given elevated WBC 6. Elevated LFT 1. Elevated alk phos with transaminitis 2. Multiple gallstones on abd [place] 3. LFT's trending down 7. Thrombocytopenia 1. Now improving 8. Hypothyroid 1. On thyroid replacement 9. DVT prophylaxis 1. scd's 10. Edema 1. Will cont with lasix  Code Status: Full Family Communication: Pt in room Disposition Plan: Pending   Consultants:  PCCM  Procedures:    Antibiotics: Zosyn 7/9>>> 7/10  Vancomycin 7/9>>> 7/10  Flagyl 7/9>>>  Levaquin 7/10 >>  HPI/Subjective: No acute events noted overnight. Denies SOB  Objective: Filed Vitals:   03/28/14 0159 03/28/14 0612 03/28/14 0853 03/28/14 0946  BP:  125/76  133/77  Pulse:  87  97  Temp:  97.5 F (36.4 C)    TempSrc:  Oral    Resp:  20    Height:      Weight:      SpO2: 92% 95% 91%    No intake or output data in the 24 hours ending 03/28/14 1033 Filed Weights   03/24/14 2000 03/25/14 0459  Weight: 160 lb 7.9 oz (72.8 kg) 161 lb 9.6 oz (73.3 kg)    Exam:   General:  Awake, in no acute distress  Cardiovascular: regular, s1, s2  Respiratory: normal resp effort, no wheezing, decreased BS  Abdomen: soft, nondistended  Musculoskeletal: perfused, no cyanosis  Data Reviewed: Basic Metabolic Panel:  Recent Labs Lab 03/24/14 1810  03/24/14 2231 03/25/14 0316 03/26/14 0255  03/27/14 1017 03/28/14 0820  NA  --   < > 133* 139 145 138 138  K  --   < > 6.0* 3.9 2.8* 5.0 5.1  CL  --   < > 91* 94* 106 101 102  CO2  --   < > 23 23 27 25 23   GLUCOSE  --   < > 155* 128* 148* 95 141*  BUN  --   < > 75* 71* 35* 19 24*  CREATININE  --   < > 4.04* 3.52* 1.48* 1.15* 1.03  CALCIUM  --   < > 7.7* 8.0* 7.8* 8.7 8.8  MG  --   --   --  2.6*  --   --   --   PHOS 1.4*  --   --  5.0*  --   --   --   < > = values in this interval not displayed. Liver Function Tests:  Recent Labs Lab 03/24/14 1516 03/24/14 2040 03/25/14 0316 03/26/14 0255  AST 2324* 1488* 1049* 337*  ALT 3083* 2408* 2254* 1312*  ALKPHOS 200* 161* 177* 137*  BILITOT 2.5* 2.0* 1.8* 1.0  PROT 7.4 6.6 6.6 5.4*  ALBUMIN 3.5 3.0* 3.1* 2.4*   No results found for this basename: LIPASE, AMYLASE,  in the last 168 hours  Recent Labs Lab 03/24/14 1516  AMMONIA 51   CBC:  Recent Labs Lab 03/24/14 1516 03/24/14 1900 03/24/14  2040 03/25/14 0316 03/26/14 0255 03/27/14 1017 03/28/14 0820  WBC 11.5* QUESTIONABLE RESULTS, RECOMMEND RECOLLECT TO VERIFY 10.0 11.0* 11.0* 26.1* 29.2*  NEUTROABS 9.4* QUESTIONABLE RESULTS, RECOMMEND RECOLLECT TO VERIFY 7.8*  --   --   --   --   HGB 15.4* 10.0* 14.9 14.7 13.4 14.8 14.8  HCT 46.1* 29.6* 43.6 43.1 41.4 44.9 45.9  MCV 92.6 93.1 91.4 91.7 94.7 94.5 94.8  PLT 81* 53* 74* 71* 57* 65* 78*   Cardiac Enzymes:  Recent Labs Lab 03/24/14 1810 03/26/14 0255 03/26/14 0855  CKTOTAL 158  --   --   TROPONINI  --  0.47* 0.30*   BNP (last 3 results)  Recent Labs  03/24/14 1714  PROBNP 51342.0*   CBG:  Recent Labs Lab 03/25/14 1952 03/26/14 0001 03/26/14 0400 03/26/14 0728 03/26/14 1154  GLUCAP 83 135* 143* 102* 105*    Recent Results (from the past 240 hour(s))  CULTURE, BLOOD (ROUTINE X 2)     Status: None   Collection Time    03/24/14  6:15 PM      Result Value Ref Range Status   Specimen Description BLOOD ARM RIGHT   Final   Special Requests  BOTTLES DRAWN AEROBIC AND ANAEROBIC 10CC   Final   Culture  Setup Time     Final   Value: 03/25/2014 00:42     Performed at [facility]   Culture     Final   Value:        BLOOD CULTURE RECEIVED NO GROWTH TO DATE CULTURE WILL BE HELD FOR 5 DAYS BEFORE ISSUING A FINAL NEGATIVE REPORT     Performed at [facility]   Report Status PENDING   Incomplete  CULTURE, BLOOD (ROUTINE X 2)     Status: None   Collection Time    03/24/14  6:40 PM      Result Value Ref Range Status   Specimen Description BLOOD ARM RIGHT   Final   Special Requests BOTTLES DRAWN AEROBIC AND ANAEROBIC 10CC   Final   Culture  Setup Time     Final   Value: 03/25/2014 00:42     Performed at [facility]   Culture     Final   Value:        BLOOD CULTURE RECEIVED NO GROWTH TO DATE CULTURE WILL BE HELD FOR 5 DAYS BEFORE ISSUING A FINAL NEGATIVE REPORT     Performed at [facility]   Report Status PENDING   Incomplete  MRSA PCR SCREENING     Status: None   Collection Time    03/24/14  8:30 PM      Result Value Ref Range Status   MRSA by PCR NEGATIVE  NEGATIVE Final   Comment:            The GeneXpert MRSA Assay (FDA     approved for NASAL specimens     only), is one component of a     comprehensive MRSA colonization     surveillance program. It is not     intended to diagnose MRSA     infection nor to guide or     monitor treatment for     MRSA infections.  URINE CULTURE     Status: None   Collection Time    03/24/14 10:16 PM      Result Value Ref Range Status   Specimen Description URINE, CATHETERIZED   Final   Special Requests Normal  Final   Culture  Setup Time     Final   Value: 03/25/2014 09:13     Performed at [place]     Final   Value: NO GROWTH     Performed at [facility]   Culture     Final   Value: NO GROWTH     Performed at [facility]   Report Status 03/26/2014 FINAL   Final  STOOL CULTURE     Status: None    Collection Time    03/25/14  2:29 AM      Result Value Ref Range Status   Specimen Description STOOL   Final   Special Requests Normal   Final   Culture     Final   Value: NO SALMONELLA, SHIGELLA, CAMPYLOBACTER, YERSINIA, OR E.COLI 0157:H7 ISOLATED     Performed at [facility]   Report Status 03/28/2014 FINAL   Final  CLOSTRIDIUM DIFFICILE BY PCR     Status: None   Collection Time    03/25/14  2:29 AM      Result Value Ref Range Status   C difficile by pcr NEGATIVE  NEGATIVE Final     Studies: No results found.  Scheduled Meds: . antiseptic oral rinse  15 mL Mouth Rinse q12n4p  . aspirin  81 mg Oral Daily  . bisoprolol  5 mg Oral Daily  . chlorhexidine  15 mL Mouth Rinse BID  . diphenhydrAMINE  25 mg Intravenous Once  . furosemide  40 mg Intravenous Daily  . ipratropium-albuterol  3 mL Nebulization Q6H  . [START ON 03/29/2014] levofloxacin  750 mg Oral Q48H  . levothyroxine  50 mcg Oral QAC breakfast  . methylPREDNISolone (SOLU-MEDROL) injection  60 mg Intravenous Q6H  . metroNIDAZOLE  500 mg Oral 3 times per day  . vancomycin  1,250 mg Intravenous Q24H   Continuous Infusions: . dextrose Stopped (03/27/14 1534)    Active Problems:   Altered mental status   Transaminitis   Acute renal failure   Acute respiratory failure   Encephalopathy acute  Time spent: 69mn  Sedric Guia, SIsle of HopeHospitalists Pager [phone] If 7PM-7AM, please contact night-coverage at [email or web address], password [email or web address]7/13/2015, 10:33 AM  LOS: 4 days

## 2014-03-29 LAB — COMPREHENSIVE METABOLIC PANEL
ALT: 370 U/L — ABNORMAL HIGH (ref 0–35)
AST: 25 U/L (ref 0–37)
Albumin: 2.4 g/dL — ABNORMAL LOW (ref 3.5–5.2)
Alkaline Phosphatase: 135 U/L — ABNORMAL HIGH (ref 39–117)
Anion gap: 12 (ref 5–15)
BUN: 34 mg/dL — ABNORMAL HIGH (ref 6–23)
CALCIUM: 8.1 mg/dL — AB (ref 8.4–10.5)
CO2: 25 meq/L (ref 19–32)
Chloride: 102 mEq/L (ref 96–112)
Creatinine, Ser: 1.08 mg/dL (ref 0.50–1.10)
GFR, EST AFRICAN AMERICAN: 61 mL/min — AB (ref >90)
GFR, EST NON AFRICAN AMERICAN: 52 mL/min — AB (ref >90)
GLUCOSE: 160 mg/dL — AB (ref 70–99)
Potassium: 5.4 mEq/L — ABNORMAL HIGH (ref 3.7–5.3)
Sodium: 139 mEq/L (ref 137–147)
Total Bilirubin: 0.6 mg/dL (ref 0.3–1.2)
Total Protein: 5.3 g/dL — ABNORMAL LOW (ref 6.0–8.3)

## 2014-03-29 LAB — CBC
HCT: 44.9 % (ref 36.0–46.0)
Hemoglobin: 14.4 g/dL (ref 12.0–15.0)
MCH: 30.6 pg (ref 26.0–34.0)
MCHC: 32.1 g/dL (ref 30.0–36.0)
MCV: 95.3 fL (ref 78.0–100.0)
PLATELETS: 102 10*3/uL — AB (ref 150–400)
RBC: 4.71 MIL/uL (ref 3.87–5.11)
RDW: 14.8 % (ref 11.5–15.5)
WBC: 34.7 10*3/uL — ABNORMAL HIGH (ref 4.0–10.5)

## 2014-03-29 LAB — OPIATE, QUANTITATIVE, URINE
Codeine Urine: 17773 ng/mL — ABNORMAL HIGH (ref <50)
HYDROCODONE: 86 ng/mL — AB (ref <50)
Hydromorphone GC/MS Conf: NEGATIVE ng/mL (ref <50)
MORPHINE CONFIRM: 6662 ng/mL — AB (ref <50)
Norhydrocodone, Ur: NEGATIVE ng/mL (ref <50)
Noroxycodone, Ur: NEGATIVE ng/mL (ref <50)
Oxycodone, ur: NEGATIVE ng/mL (ref <50)
Oxymorphone: NEGATIVE ng/mL (ref <50)

## 2014-03-29 MED ORDER — IPRATROPIUM-ALBUTEROL 0.5-2.5 (3) MG/3ML IN SOLN
3.0000 mL | Freq: Three times a day (TID) | RESPIRATORY_TRACT | Status: DC
  Administered 2014-03-30 – 2014-03-31 (×6): 3 mL via RESPIRATORY_TRACT
  Filled 2014-03-29 (×6): qty 3

## 2014-03-29 MED ORDER — FUROSEMIDE 10 MG/ML IJ SOLN
40.0000 mg | Freq: Two times a day (BID) | INTRAMUSCULAR | Status: DC
  Administered 2014-03-29 – 2014-04-01 (×7): 40 mg via INTRAVENOUS
  Filled 2014-03-29 (×9): qty 4

## 2014-03-29 NOTE — Progress Notes (Signed)
CARE MANAGEMENT NOTE 03/29/2014  Patient:  [name],[name]   Account Number:  [id number]  Date Initiated:  03/25/2014  Documentation initiated by:  [name]  Subjective/Objective Assessment:   Admitted with AMS - diarrhea - dehydration     Action/Plan:   pt eval- rec hhpt   Anticipated DC Date:  03/28/2014   Anticipated DC Plan:  HOME W HOME HEALTH SERVICES      DC Planning Services  CM consult      [facility]   Choice offered to / List presented to:  C-5 Sibling        HH arranged  HH-2 PT  HH-1 RN      [facility] agency  [facility]   Status of service:  Completed, signed off Medicare Important Message given?  YES (If response is "NO", the following Medicare IM given date fields will be blank) Date Medicare IM given:  03/28/2014 Medicare IM given by:  [name],DEBORAH Date Additional Medicare IM given:   Additional Medicare IM given by:    Discharge Disposition:  HOME W HOME HEALTH SERVICES  Per UR Regulation:  Reviewed for med. necessity/level of care/duration of stay  If discussed at Long Length of Stay Meetings, dates discussed:    Comments:  03/29/2014 1520 NCM spoke to pt and [facility]. States she lives at home with husband. NCM explained she will need HH RN and PT at home. [facility] States she has RW, wheelchair and shower chair at home. Lives at home with husband but he is not able to assist at home. Sister, [name]  lives close to pt and will be able to assist at home. Explained to pt oxygen maybe needed for home. States she is hoping she does not need oxygen for home. Waiting final dc recommendations for home. Will recheck oxygen saturation closer to dc. [facility] Isidoro DonningAlesia Gregroy Dombkowski RN CCM Case Mgmt phone [phone]    Contact:   Bullard,[name] Mother [phone]                  Cox,[name] Sister [phone]

## 2014-03-29 NOTE — Progress Notes (Addendum)
[facility] PROGRESS NOTE  [name] SVX:[id number] DOB: Oct 24, 1945 DOA: 03/24/2014 PCP: Leonard Downing, MD  Off Service Summary: 24RA who presented initially with confusion with acute respiratory failure on BiPAP with acute renal failure and admitted to the critical care service. She was found to have pneumonia and continued on pna coverage. Pt was also noted to have diarrhea and started on empiric flagyl. Cdif has been serially negative, thus flagy has since been discontinued. The patient's renal function normalize with fluids and she was transferred to the hospitalist service. Since transfer, the patient's WBC was noted to be elevated with cultures thus far neg. The patient was noted to have marked wheezing on exam and IV steroids started. WBC has since risen with steroids. Pt also developed a rash over the LE initially thought to be cellulitis but does not to be cellulitis, thus vanc stopped.  Assessment/Plan: 1. RLL PNA 1. On levaquin 2. Afebrile 3. WBC up to mid 30's today, however pt is now on IV steroids (see below) - afebrile 4. Repeat cdiff toxin - neg 5. Will repeat cxr in AM for interval change 2. COPD 1. Decreased bs, wheezing improved 2. Cont with duonebs 3. Cont IV steroids 4. Pt still O2 dependent on 4L [place], normally does not require O2 3. HTN 1. BP stable 4. Acute renal failure 1. Renal function steadily improving 2. Follow renal function 5. Diarrhea 1. Cdiff stool neg on 7/9 2. Resolved 6. Elevated LFT 1. Elevated alk phos with transaminitis 2. Multiple gallstones on abd [place] w/o obstruction 3. LFT's continuing to trend down 7. Thrombocytopenia 1. Now improving 8. Hypothyroid 1. On thyroid replacement 9. DVT prophylaxis 1. scd's 10. Edema 1. Will cont with lasix 2. Follow strict i/o and daily weights 11. Rash 1. Pt with confluent macular-papular rash over B LE extending just above the waist. 2. Cellulitis is unlikely. Question drug  rash? 3. Will hold flagyl and vanc 4. Monitor for now  Code Status: Full Family Communication: Pt in room Disposition Plan: Pending   Consultants:  PCCM  Procedures:    Antibiotics: Zosyn 7/9>>> 7/10  Vancomycin 7/9>>> 7/14 Flagyl 7/9>>> 7/14 Levaquin 7/10 >>  HPI/Subjective: No acute events noted. Cont to feel generally ill. Objective: Filed Vitals:   03/28/14 2137 03/29/14 0100 03/29/14 0456 03/29/14 0758  BP:   124/74   Pulse: 92 96 96   Temp:   98.1 F (36.7 C)   TempSrc:   Oral   Resp: _0 Height:      Weight:      SpO2: 92% 94% 94% 94%    Intake/Output Summary (Last 24 hours) at 03/29/14 1311 Last data filed at 03/29/14 0900  Gross per 24 hour  Intake    236 ml  Output      0 ml  Net    236 ml   Filed Weights   03/24/14 2000 03/25/14 0459  Weight: 160 lb 7.9 oz (72.8 kg) 161 lb 9.6 oz (73.3 kg)    Exam:   General:  Awake, in no acute distress  Cardiovascular: regular, s1, s2  Respiratory: normal resp effort, no wheezing, decreased BS  Abdomen: soft, nondistended  Musculoskeletal: perfused, no cyanosis  Data Reviewed: Basic Metabolic Panel:  Recent Labs Lab 03/24/14 1810  03/25/14 0316 03/26/14 0255 03/27/14 1017 03/28/14 0820 03/29/14 0740  NA  --   < > 139 145 138 138 139  K  --   < > 3.9 2.8* 5.0 5.1 5.4*  CL  --   < > 94* 106 101 102 102  CO2  --   < > _0 GLUCOSE  --   < > 128* 148* 95 141* 160*  BUN  --   < > 71* 35* 19 24* 34*  CREATININE  --   < > 3.52* 1.48* 1.15* 1.03 1.08  CALCIUM  --   < > 8.0* 7.8* 8.7 8.8 8.1*  MG  --   --  2.6*  --   --   --   --   PHOS 1.4*  --  5.0*  --   --   --   --   < > = values in this interval not displayed. Liver Function Tests:  Recent Labs Lab 03/24/14 1516 03/24/14 2040 03/25/14 0316 03/26/14 0255 03/29/14 0740  AST 2324* 1488* 1049* 337* 25  ALT 3083* 2408* 2254* 1312* 370*  ALKPHOS 200* 161* 177* 137* 135*  BILITOT 2.5* 2.0* 1.8* 1.0 0.6  PROT 7.4  6.6 6.6 5.4* 5.3*  ALBUMIN 3.5 3.0* 3.1* 2.4* 2.4*   No results found for this basename: LIPASE, AMYLASE,  in the last 168 hours  Recent Labs Lab 03/24/14 1516  AMMONIA 51   CBC:  Recent Labs Lab 03/24/14 1516 03/24/14 1900 03/24/14 2040 03/25/14 0316 03/26/14 0255 03/27/14 1017 03/28/14 0820 03/29/14 0935  WBC 11.5* QUESTIONABLE RESULTS, RECOMMEND RECOLLECT TO VERIFY 10.0 11.0* 11.0* 26.1* 29.2* 34.7*  NEUTROABS 9.4* QUESTIONABLE RESULTS, RECOMMEND RECOLLECT TO VERIFY 7.8*  --   --   --   --   --   HGB 15.4* 10.0* 14.9 14.7 13.4 14.8 14.8 14.4  HCT 46.1* 29.6* 43.6 43.1 41.4 44.9 45.9 44.9  MCV 92.6 93.1 91.4 91.7 94.7 94.5 94.8 95.3  PLT 81* 53* 74* 71* 57* 65* 78* 102*   Cardiac Enzymes:  Recent Labs Lab 03/24/14 1810 03/26/14 0255 03/26/14 0855  CKTOTAL 158  --   --   TROPONINI  --  0.47* 0.30*   BNP (last 3 results)  Recent Labs  03/24/14 1714  PROBNP 51342.0*   CBG:  Recent Labs Lab 03/25/14 1952 03/26/14 0001 03/26/14 0400 03/26/14 0728 03/26/14 1154  GLUCAP 83 135* 143* 102* 105*    Recent Results (from the past 240 hour(s))  CULTURE, BLOOD (ROUTINE X 2)     Status: None   Collection Time    03/24/14  6:15 PM      Result Value Ref Range Status   Specimen Description BLOOD ARM RIGHT   Final   Special Requests BOTTLES DRAWN AEROBIC AND ANAEROBIC 10CC   Final   Culture  Setup Time     Final   Value: 03/25/2014 00:42     Performed at [facility]   Culture     Final   Value:        BLOOD CULTURE RECEIVED NO GROWTH TO DATE CULTURE WILL BE HELD FOR 5 DAYS BEFORE ISSUING A FINAL NEGATIVE REPORT     Performed at [facility]   Report Status PENDING   Incomplete  CULTURE, BLOOD (ROUTINE X 2)     Status: None   Collection Time    03/24/14  6:40 PM      Result Value Ref Range Status   Specimen Description BLOOD ARM RIGHT   Final   Special Requests BOTTLES DRAWN AEROBIC AND ANAEROBIC 10CC   Final   Culture  Setup Time     Final  Value: 03/25/2014 00:42     Performed at [facility]   Culture     Final   Value:        BLOOD CULTURE RECEIVED NO GROWTH TO DATE CULTURE WILL BE HELD FOR 5 DAYS BEFORE ISSUING A FINAL NEGATIVE REPORT     Performed at [facility]   Report Status PENDING   Incomplete  MRSA PCR SCREENING     Status: None   Collection Time    03/24/14  8:30 PM      Result Value Ref Range Status   MRSA by PCR NEGATIVE  NEGATIVE Final   Comment:            The GeneXpert MRSA Assay (FDA     approved for NASAL specimens     only), is one component of a     comprehensive MRSA colonization     surveillance program. It is not     intended to diagnose MRSA     infection nor to guide or     monitor treatment for     MRSA infections.  URINE CULTURE     Status: None   Collection Time    03/24/14 10:16 PM      Result Value Ref Range Status   Specimen Description URINE, CATHETERIZED   Final   Special Requests Normal   Final   Culture  Setup Time     Final   Value: 03/25/2014 09:13     Performed at [facility]     Final   Value: NO GROWTH     Performed at [facility]   Culture     Final   Value: NO GROWTH     Performed at [facility]   Report Status 03/26/2014 FINAL   Final  STOOL CULTURE     Status: None   Collection Time    03/25/14  2:29 AM      Result Value Ref Range Status   Specimen Description STOOL   Final   Special Requests Normal   Final   Culture     Final   Value: NO SALMONELLA, SHIGELLA, CAMPYLOBACTER, YERSINIA, OR E.COLI 0157:H7 ISOLATED     Performed at [facility]   Report Status 03/28/2014 FINAL   Final  CLOSTRIDIUM DIFFICILE BY PCR     Status: None   Collection Time    03/25/14  2:29 AM      Result Value Ref Range Status   C difficile by pcr NEGATIVE  NEGATIVE Final  CLOSTRIDIUM DIFFICILE BY PCR     Status: None   Collection Time    03/28/14  9:03 AM      Result Value Ref Range Status   C difficile by pcr  NEGATIVE  NEGATIVE Final     Studies: No results found.  Scheduled Meds: . antiseptic oral rinse  15 mL Mouth Rinse q12n4p  . aspirin  81 mg Oral Daily  . bisoprolol  5 mg Oral Daily  . chlorhexidine  15 mL Mouth Rinse BID  . furosemide  40 mg Intravenous Daily  . ipratropium-albuterol  3 mL Nebulization Q6H  . levofloxacin  750 mg Oral Q24H  . levothyroxine  50 mcg Oral QAC breakfast  . methylPREDNISolone (SOLU-MEDROL) injection  60 mg Intravenous Q6H   Continuous Infusions: . dextrose 30 mL/hr at 03/29/14 0046    Active Problems:   Altered mental status   Transaminitis   Acute  renal failure   Acute respiratory failure   Encephalopathy acute  Time spent: 31mn  [place] Hesketh, SR[facility]ospitalists Pager [phone] If 7PM-7AM, please contact night-coverage at [email or web address], password [email or web address]7/14/2015, 1:11 PM  LOS: 5 days

## 2014-03-29 NOTE — Progress Notes (Signed)
03/29/2014 1700 NCM [facility]. Isidoro DonningAlesia Prudence Heiny RN CCM Case Mgmt phone [phone]

## 2014-03-29 NOTE — Progress Notes (Signed)
Physical Therapy Treatment Patient Details Name: [name] MRN: [id number] DOB: 04/02/1946 Today's Date: 03/29/2014    History of Present Illness Patient is a 68 yo female admitted 7 9/15 with AMS, weakness, diarrhea.  Patient with CAP, hypotension, ARF.  PMH: CHF, COPD, pna.    PT Comments    Pt making good progress towards mobility goals.  Note that she continues to need supplemental O2 during all mobility, however noted MARKED improvement with use of mask.  See ambulation section for full details.  Notified RN about O2 sats and MD notified (per pt request) about continued swelling.  He states that he is going to increase her Lasix.    Follow Up Recommendations  Home health PT;Supervision/Assistance - 24 hour     Equipment Recommendations  None recommended by PT    Recommendations for Other Services       Precautions / Restrictions Precautions Precautions: Fall Precaution Comments: watch sats    Mobility  Bed Mobility   Bed Mobility:  (Pt in recliner when PT arrived)              Transfers Overall transfer level: Needs assistance Equipment used: Rolling walker (2 wheeled) Transfers: Sit to/from Stand Sit to Stand: Min guard         General transfer comment: Min cues for hand placement and safety, as she tends to set walker aside.   Ambulation/Gait Ambulation/Gait assistance: Supervision Ambulation Distance (Feet): 65 Feet (x 2 reps) Assistive device: Rolling walker (2 wheeled) Gait Pattern/deviations: Step-through pattern;Decreased stride length;Trunk flexed     General Gait Details: Pt with very slow gait speed.  cues for upright posture and pursed lip breathing with nasal cannula.  Trialed gait with use of mask for increased perfusion as she was noted to mouth breath with nasal cannula and desats to 83% on 8L O2.  With mask on in sitting, PT was able to decrease supplemental O2 to 4L with SaO2 at 96%.  Ambulated another 5365' with mask on 4 LO2 with saO2  dropping to 86%, however with rest and deep breathing, she was able to increase to 90%.    Stairs            Wheelchair Mobility    Modified Rankin (Stroke Patients Only)       Balance                                    Cognition Arousal/Alertness: Awake/alert Behavior During Therapy: WFL for tasks assessed/performed Overall Cognitive Status: No family/caregiver present to determine baseline cognitive functioning           Safety/Judgement: Decreased awareness of deficits;Decreased awareness of safety          Exercises      General Comments        Pertinent Vitals/Pain No pain    Home Living                      Prior Function            PT Goals (current goals can now be found in the care plan section) Acute Rehab PT Goals PT Goal Formulation: With patient Time For Goal Achievement: 04/03/14 Potential to Achieve Goals: Good Progress towards PT goals: Progressing toward goals    Frequency  Min 3X/week    PT Plan Current plan remains appropriate    Co-evaluation  End of Session Equipment Utilized During Treatment: Oxygen Activity Tolerance: Patient tolerated treatment well Patient left: in chair;with call bell/phone within reach;with chair alarm set     Time: 1610-9604 PT Time Calculation (min): 28 min  Charges:  $Gait Training: 23-37 mins                    G Codes:      Vista Deck 03/29/2014, 4:30 PM

## 2014-03-29 NOTE — Progress Notes (Signed)
Pt has edema in her bilateral upper extremity. K. Schorr NP paged twice if we can discontinue the order for D5w. New order received with decrease in rate to 30 cc/hr. Will monitor.

## 2014-03-30 ENCOUNTER — Inpatient Hospital Stay (HOSPITAL_COMMUNITY): Payer: Medicare Other

## 2014-03-30 DIAGNOSIS — R21 Rash and other nonspecific skin eruption: Secondary | ICD-10-CM

## 2014-03-30 LAB — CBC WITH DIFFERENTIAL/PLATELET
BASOS PCT: 0 % (ref 0–1)
Basophils Absolute: 0 10*3/uL (ref 0.0–0.1)
EOS PCT: 0 % (ref 0–5)
Eosinophils Absolute: 0 10*3/uL (ref 0.0–0.7)
HCT: 41.8 % (ref 36.0–46.0)
HEMOGLOBIN: 14 g/dL (ref 12.0–15.0)
Lymphocytes Relative: 1 % — ABNORMAL LOW (ref 12–46)
Lymphs Abs: 0.2 10*3/uL — ABNORMAL LOW (ref 0.7–4.0)
MCH: 30.7 pg (ref 26.0–34.0)
MCHC: 33.5 g/dL (ref 30.0–36.0)
MCV: 91.7 fL (ref 78.0–100.0)
Monocytes Absolute: 0.9 10*3/uL (ref 0.1–1.0)
Monocytes Relative: 4 % (ref 3–12)
NEUTROS ABS: 21 10*3/uL — AB (ref 1.7–7.7)
Neutrophils Relative %: 95 % — ABNORMAL HIGH (ref 43–77)
Platelets: 133 10*3/uL — ABNORMAL LOW (ref 150–400)
RBC: 4.56 MIL/uL (ref 3.87–5.11)
RDW: 15 % (ref 11.5–15.5)
WBC: 22.1 10*3/uL — AB (ref 4.0–10.5)

## 2014-03-30 LAB — COMPREHENSIVE METABOLIC PANEL
ALK PHOS: 131 U/L — AB (ref 39–117)
ALT: 296 U/L — ABNORMAL HIGH (ref 0–35)
AST: 40 U/L — AB (ref 0–37)
Albumin: 2.8 g/dL — ABNORMAL LOW (ref 3.5–5.2)
Anion gap: 16 — ABNORMAL HIGH (ref 5–15)
BUN: 39 mg/dL — ABNORMAL HIGH (ref 6–23)
CHLORIDE: 97 meq/L (ref 96–112)
CO2: 27 mEq/L (ref 19–32)
Calcium: 8.2 mg/dL — ABNORMAL LOW (ref 8.4–10.5)
Creatinine, Ser: 1.18 mg/dL — ABNORMAL HIGH (ref 0.50–1.10)
GFR, EST AFRICAN AMERICAN: 54 mL/min — AB (ref >90)
GFR, EST NON AFRICAN AMERICAN: 47 mL/min — AB (ref >90)
GLUCOSE: 142 mg/dL — AB (ref 70–99)
POTASSIUM: 4.5 meq/L (ref 3.7–5.3)
SODIUM: 140 meq/L (ref 137–147)
Total Bilirubin: 0.7 mg/dL (ref 0.3–1.2)
Total Protein: 6 g/dL (ref 6.0–8.3)

## 2014-03-30 MED ORDER — METHYLPREDNISOLONE SODIUM SUCC 40 MG IJ SOLR
40.0000 mg | Freq: Three times a day (TID) | INTRAMUSCULAR | Status: DC
  Administered 2014-03-30 – 2014-03-31 (×3): 40 mg via INTRAVENOUS
  Filled 2014-03-30 (×9): qty 1

## 2014-03-30 NOTE — Progress Notes (Signed)
PATIENT DETAILS Name: [name] Age: 68 y.o. Sex: female Date of Birth: 09/24/1945 Admit Date: 03/24/2014 Admitting Physician Alyson Reedy, MD [id number] [name], MD  Subjective: No major complaints  Assessment/Plan: Active Problems:   Altered mental status -Secondary to metabolic encephalopathy-resolved -CT head negative  COPD exacerbation -still hypoxic,but lungs mostly clear-has some bibasilar rales -decrease solumedrol, c/w scheduled nebs. Check BNP, does have underlying chronic diastolic dysfunction-however already on Lasix  CAP -afebrile, leukocytosis decreasing. All cultures negative so far -on levaquin-stop date 04/01/14-will d/w ID if it can be stopped-given the fact patient has received more than 5 days of Abx  Rash -suspected drug rash -have asked ID-Dr Orvan Falconer- to evaluate-minimize antibiotics/meds-as much as possible. Suspect this was related to her antibiotics.  Acute Hypoxic resp failure -secondary to above -still hypoxic, check BNP-given underlying diastolic dysfunction -check doppler legs  Leukocytosis -likely leukemoid reaction- see above regarding Abx  ARF -resolved -suspect this was pre-renal   Transaminitis -secondary to acute illness, acute hepatitis serology neg -downtrending, Abd Ultrasound neg for acute abnormalities  Diarrhea -resolved -C Diff PCR neg  Thrombocytopenia -secondary to acute illness -improving  Hypothyroidism -c/w Levothyroxine  HTN -c/w Bisoprolol -BP controlled  Disposition: Remain inpatient  DVT Prophylaxis:  SCD's  Code Status: Full code   Family Communication None at bedside  Procedures:  None  CONSULTS:  ID  Time spent 40 minutes-which includes 50% of the time with face-to-face with patient/ family and coordinating care related to the above assessment and plan.   MEDICATIONS: Scheduled Meds: . antiseptic oral rinse  15 mL Mouth Rinse q12n4p  . aspirin  81 mg Oral  Daily  . bisoprolol  5 mg Oral Daily  . chlorhexidine  15 mL Mouth Rinse BID  . furosemide  40 mg Intravenous BID  . ipratropium-albuterol  3 mL Nebulization TID  . levofloxacin  750 mg Oral Q24H  . levothyroxine  50 mcg Oral QAC breakfast  . methylPREDNISolone (SOLU-MEDROL) injection  40 mg Intravenous 3 times per day   Continuous Infusions:  PRN Meds:.ipratropium-albuterol  Antibiotics: Anti-infectives   Start     Dose/Rate Route Frequency Ordered Stop   03/29/14 1315  levofloxacin (LEVAQUIN) tablet 750 mg  Status:  Discontinued     750 mg Oral Every 48 hours 03/28/14 0818 03/28/14 1511   03/28/14 1800  levofloxacin (LEVAQUIN) tablet 750 mg     750 mg Oral Every 24 hours 03/28/14 1511 04/01/14 1759   03/27/14 2200  vancomycin (VANCOCIN) 1,250 mg in sodium chloride 0.9 % 250 mL IVPB  Status:  Discontinued     1,250 mg 166.7 mL/hr over 90 Minutes Intravenous Every 24 hours 03/27/14 2124 03/29/14 0928   03/27/14 1230  levofloxacin (LEVAQUIN) tablet 250 mg  Status:  Discontinued     250 mg Oral Daily 03/26/14 1224 03/28/14 0818   03/27/14 1000  levofloxacin (LEVAQUIN) IVPB 500 mg  Status:  Discontinued     500 mg 100 mL/hr over 60 Minutes Intravenous Every 48 hours 03/25/14 1005 03/26/14 1156   03/26/14 2100  vancomycin (VANCOCIN) IVPB 1000 mg/200 mL premix  Status:  Discontinued     1,000 mg 200 mL/hr over 60 Minutes Intravenous Every 48 hours 03/25/14 0839 03/25/14 1005   03/26/14 1400  metroNIDAZOLE (FLAGYL) tablet 500 mg  Status:  Discontinued     500 mg Oral 3 times per day 03/26/14 1156 03/29/14 0928   03/26/14 1230  levofloxacin (LEVAQUIN) tablet 500 mg  500 mg Oral Daily 03/26/14 1224 03/26/14 1325   03/26/14 1200  levofloxacin (LEVAQUIN) tablet 500 mg  Status:  Discontinued     500 mg Oral Daily 03/26/14 1156 03/26/14 1223   03/25/14 1100  levofloxacin (LEVAQUIN) IVPB 750 mg     750 mg 100 mL/hr over 90 Minutes Intravenous  Once 03/25/14 1005 03/25/14 1354   03/24/14  1930  aztreonam (AZACTAM) 500 mg in dextrose 5 % 50 mL IVPB  Status:  Discontinued     500 mg 100 mL/hr over 30 Minutes Intravenous 3 times per day 03/24/14 1849 03/25/14 1006   03/24/14 1900  vancomycin (VANCOCIN) IVPB 1000 mg/200 mL premix     1,000 mg 200 mL/hr over 60 Minutes Intravenous  Once 03/24/14 1850 03/24/14 2247   03/24/14 1815  metroNIDAZOLE (FLAGYL) IVPB 500 mg  Status:  Discontinued     500 mg 100 mL/hr over 60 Minutes Intravenous Every 8 hours 03/24/14 1812 03/26/14 1156       PHYSICAL EXAM: Vital signs in last 24 hours: Filed Vitals:   03/30/14 0916 03/30/14 1006 03/30/14 1016 03/30/14 1019  BP:  130/79    Pulse:  99    Temp:      TempSrc:      Resp:      Height:      Weight:      SpO2: 98%  73% 92%    Weight change:  Filed Weights   03/24/14 2000 03/25/14 0459 03/30/14 0445  Weight: 72.8 kg (160 lb 7.9 oz) 73.3 kg (161 lb 9.6 oz) 75.561 kg (166 lb 9.3 oz)   Body mass index is 27.72 kg/(m^2).   Gen Exam: Awake and alert with clear speech.   Neck: Supple, No JVD.   Chest: few bibasilar rales, some scattered rhonchi-moving air b/l CVS: S1 S2 Regular, no murmurs.  Abdomen: soft, BS +, non tender, non distended.  Extremities: no edema, lower extremities warm to touch. Neurologic: Non Focal.   Skin:Shows confluent petechial rash on her feet and ankles extending up to the level of the groin. Wounds: N/A.    Intake/Output from previous day:  Intake/Output Summary (Last 24 hours) at 03/30/14 1241 Last data filed at 03/30/14 1000  Gross per 24 hour  Intake    722 ml  Output    800 ml  Net    -78 ml     LAB RESULTS: CBC  Recent Labs Lab 03/24/14 1516 03/24/14 1900 03/24/14 2040  03/26/14 0255 03/27/14 1017 03/28/14 0820 03/29/14 0935 03/30/14 0747  WBC 11.5* QUESTIONABLE RESULTS, RECOMMEND RECOLLECT TO VERIFY 10.0  < > 11.0* 26.1* 29.2* 34.7* 22.1*  HGB 15.4* 10.0* 14.9  < > 13.4 14.8 14.8 14.4 14.0  HCT 46.1* 29.6* 43.6  < > 41.4 44.9  45.9 44.9 41.8  PLT 81* 53* 74*  < > 57* 65* 78* 102* 133*  MCV 92.6 93.1 91.4  < > 94.7 94.5 94.8 95.3 91.7  MCH 30.9 31.4 31.2  < > 30.7 31.2 30.6 30.6 30.7  MCHC 33.4 33.8 34.2  < > 32.4 33.0 32.2 32.1 33.5  RDW 13.0 12.8 12.9  < > 13.4 14.1 14.6 14.8 15.0  LYMPHSABS 1.0 QUESTIONABLE RESULTS, RECOMMEND RECOLLECT TO VERIFY 1.4  --   --   --   --   --  0.2*  MONOABS 1.0 QUESTIONABLE RESULTS, RECOMMEND RECOLLECT TO VERIFY 0.8  --   --   --   --   --  0.9  EOSABS 0.1  QUESTIONABLE RESULTS, RECOMMEND RECOLLECT TO VERIFY 0.0  --   --   --   --   --  0.0  BASOSABS 0.0 QUESTIONABLE RESULTS, RECOMMEND RECOLLECT TO VERIFY 0.0  --   --   --   --   --  0.0  < > = values in this interval not displayed.  Chemistries   Recent Labs Lab 03/25/14 0316 03/26/14 0255 03/27/14 1017 03/28/14 0820 03/29/14 0740  NA 139 145 138 138 139  K 3.9 2.8* 5.0 5.1 5.4*  CL 94* 106 101 102 102  CO2 23 27 25 23 25   GLUCOSE 128* 148* 95 141* 160*  BUN 71* 35* 19 24* 34*  CREATININE 3.52* 1.48* 1.15* 1.03 1.08  CALCIUM 8.0* 7.8* 8.7 8.8 8.1*  MG 2.6*  --   --   --   --     CBG:  Recent Labs Lab 03/25/14 1952 03/26/14 0001 03/26/14 0400 03/26/14 0728 03/26/14 1154  GLUCAP 83 135* 143* 102* 105*    GFR Estimated Creatinine Clearance: 52.1 ml/min (by C-G formula based on Cr of 1.08).  Coagulation profile  Recent Labs Lab 03/24/14 1516 03/24/14 1900 03/26/14 0255  INR 1.78* 2.75* 1.69*    Cardiac Enzymes  Recent Labs Lab 03/26/14 0255 03/26/14 0855  TROPONINI 0.47* 0.30*    No components found with this basename: POCBNP,  No results found for this basename: DDIMER,  in the last 72 hours No results found for this basename: HGBA1C,  in the last 72 hours No results found for this basename: CHOL, HDL, LDLCALC, TRIG, CHOLHDL, LDLDIRECT,  in the last 72 hours No results found for this basename: TSH, T4TOTAL, FREET3, T3FREE, THYROIDAB,  in the last 72 hours No results found for this  basename: VITAMINB12, FOLATE, FERRITIN, TIBC, IRON, RETICCTPCT,  in the last 72 hours No results found for this basename: LIPASE, AMYLASE,  in the last 72 hours  Urine Studies No results found for this basename: UACOL, UAPR, USPG, UPH, UTP, UGL, UKET, UBIL, UHGB, UNIT, UROB, ULEU, UEPI, UWBC, URBC, UBAC, CAST, CRYS, UCOM, BILUA,  in the last 72 hours  MICROBIOLOGY: Recent Results (from the past 240 hour(s))  CULTURE, BLOOD (ROUTINE X 2)     Status: None   Collection Time    03/24/14  6:15 PM      Result Value Ref Range Status   Specimen Description BLOOD ARM RIGHT   Final   Special Requests BOTTLES DRAWN AEROBIC AND ANAEROBIC 10CC   Final   Culture  Setup Time     Final   Value: 03/25/2014 00:42     Performed at [facility]   Culture     Final   Value:        BLOOD CULTURE RECEIVED NO GROWTH TO DATE CULTURE WILL BE HELD FOR 5 DAYS BEFORE ISSUING A FINAL NEGATIVE REPORT     Performed at [facility]   Report Status PENDING   Incomplete  CULTURE, BLOOD (ROUTINE X 2)     Status: None   Collection Time    03/24/14  6:40 PM      Result Value Ref Range Status   Specimen Description BLOOD ARM RIGHT   Final   Special Requests BOTTLES DRAWN AEROBIC AND ANAEROBIC 10CC   Final   Culture  Setup Time     Final   Value: 03/25/2014 00:42     Performed at [facility]   Culture     Final  Value:        BLOOD CULTURE RECEIVED NO GROWTH TO DATE CULTURE WILL BE HELD FOR 5 DAYS BEFORE ISSUING A FINAL NEGATIVE REPORT     Performed at [facility]   Report Status PENDING   Incomplete  MRSA PCR SCREENING     Status: None   Collection Time    03/24/14  8:30 PM      Result Value Ref Range Status   MRSA by PCR NEGATIVE  NEGATIVE Final   Comment:            The GeneXpert MRSA Assay (FDA     approved for NASAL specimens     only), is one component of a     comprehensive MRSA colonization     surveillance program. It is not     intended to diagnose MRSA      infection nor to guide or     monitor treatment for     MRSA infections.  URINE CULTURE     Status: None   Collection Time    03/24/14 10:16 PM      Result Value Ref Range Status   Specimen Description URINE, CATHETERIZED   Final   Special Requests Normal   Final   Culture  Setup Time     Final   Value: 03/25/2014 09:13     Performed at [facility]   Colony Count     Final   Value: NO GROWTH     Performed at [facility]   Culture     Final   Value: NO GROWTH     Performed at [facility]   Report Status 03/26/2014 FINAL   Final  STOOL CULTURE     Status: None   Collection Time    03/25/14  2:29 AM      Result Value Ref Range Status   Specimen Description STOOL   Final   Special Requests Normal   Final   Culture     Final   Value: NO SALMONELLA, SHIGELLA, CAMPYLOBACTER, YERSINIA, OR E.COLI 0157:H7 ISOLATED     Performed at [facility]   Report Status 03/28/2014 FINAL   Final  CLOSTRIDIUM DIFFICILE BY PCR     Status: None   Collection Time    03/25/14  2:29 AM      Result Value Ref Range Status   C difficile by pcr NEGATIVE  NEGATIVE Final  CLOSTRIDIUM DIFFICILE BY PCR     Status: None   Collection Time    03/28/14  9:03 AM      Result Value Ref Range Status   C difficile by pcr NEGATIVE  NEGATIVE Final    RADIOLOGY STUDIES/RESULTS: Ct Head Wo Contrast  03/24/2014   CLINICAL DATA:  Altered mental status  EXAM: CT HEAD WITHOUT CONTRAST  TECHNIQUE: Contiguous axial images were obtained from the base of the skull through the vertex without intravenous contrast.  COMPARISON:  None.  FINDINGS: Ventricle size is normal. Negative for acute or chronic infarction. Negative for hemorrhage or fluid collection. Negative for mass or edema. No shift of the midline structures.  Calvarium is intact.  IMPRESSION: Normal   Electronically Signed   By: Marlan Palau M.D.   On: 03/24/2014 16:59   US Abdomen Complete  03/24/2014   CLINICAL DATA:  Elevated  hepatic function studies; history of CHF and hypertension  EXAM: ULTRASOUND ABDOMEN COMPLETE  COMPARISON:  None.  FINDINGS: Gallbladder:  The gallbladder contains  multiple echogenic stones. Mobility could not be assessed due to patient's today ste state. No positive sonographic Murphy's sign is reported but again the patient was sedated. There is no wall thickening or pericholecystic fluid.  Common bile duct:  Diameter: Mildly dilated at 7.9 mm.  Liver:  No focal lesion identified. Within normal limits in parenchymal echogenicity. There is no focal mass nor ductal dilation.  IVC:  No abnormality visualized.  Pancreas:  Evaluation of the pancreas is limited by bowel gas.  Spleen:  Size and appearance within normal limits.  Right Kidney:  Length: 9.1 cm. The renal echotexture is approximately equal to that of the adjacent liver. No mass or hydronephrosis visualized.  Left Kidney:  Length: 10.2. Echogenicity within normal limits. There is no hydronephrosis. There are echogenic foci in the lower pole which may reflect stones.  Abdominal aorta:  There is mural calcification with maximal diameter 2.6 cm.  Other findings:  No ascites  IMPRESSION: 1. The study is limited due to the patient's sedated status and body habitus. There are multiple gallstones without evidence of acute cholecystitis. The common bile duct is mildly dilated. 2. The renal cortical echotexture is mildly increased which likely reflects medical renal disease. There may be nonobstructing lower pole stones in the left kidney.   Electronically Signed   By: David  [place]az[place]   On: 03/24/2014 18:21   Dg Chest Port 1 View  03/30/2014   CLINICAL DATA:  Hypoxia  EXAM: PORTABLE CHEST - 1 VIEW  COMPARISON:  03/26/2014  FINDINGS: Cardiac shadow is stable. Mild interstitial changes are identified bilaterally. The overall appearance is slightly more prominent given some variation in the films. Stable changes in the right lung base are seen. Small effusions are noted  bilaterally. No bony abnormality is seen.  IMPRESSION: Mild increased interstitial changes with small pleural effusions.   Electronically Signed   By: Alcide CleverMark  Lukens M.D.   On: 03/30/2014 08:04   Dg Chest Port 1 View  03/26/2014   CLINICAL DATA:  Respiratory failure  EXAM: PORTABLE CHEST - 1 VIEW  COMPARISON:  03/25/2014  FINDINGS: The cardiac shadow is stable. Increasing right basilar atelectasis is noted. Diffuse interstitial changes are again noted. No bony abnormality is seen.  IMPRESSION: Increasing right basilar atelectasis.   Electronically Signed   By: Alcide CleverMark  Lukens M.D.   On: 03/26/2014 07:19   Dg Chest Port 1 View  03/25/2014   CLINICAL DATA:  COPD  EXAM: PORTABLE CHEST - 1 VIEW  COMPARISON:  03/24/2014  FINDINGS: Cardiac shadow is stable. The lungs remain hyperinflated. Multiple calcified granulomas as well as interstitial scarring is seen. Patchy infiltrate is noted in the right mid lung which may represent acute infiltrate. No acute bony abnormality is noted.  IMPRESSION: No significant interval change from the prior exam. A portion of the changes bilaterally are chronic in nature.   Electronically Signed   By: Alcide CleverMark  Lukens M.D.   On: 03/25/2014 07:26   Dg Chest Port 1 View  03/24/2014   CLINICAL DATA:  Altered mental status.  COPD and CHF  EXAM: PORTABLE CHEST - 1 VIEW  COMPARISON:  10/06/2009  FINDINGS: COPD. Diffusely increased lung markings in the upper and lower lobes, new since the prior study. This may represent pneumonia or possibly pulmonary edema. Small right pleural effusion  IMPRESSION: COPD. Bilateral airspace disease has developed since the prior study which may represent pneumonia or edema.   Electronically Signed   By: Marlan Palauharles  Clark M.D.  On: 03/24/2014 15:39   Mm Digital Screening Bilateral  03/01/2014   CLINICAL DATA:  Screening.  EXAM: DIGITAL SCREENING BILATERAL MAMMOGRAM WITH CAD  COMPARISON:  Previous exam(s).  ACR Breast Density Category b: There are scattered areas of  fibroglandular density.  FINDINGS: There are no findings suspicious for malignancy. Images were processed with CAD.  IMPRESSION: No mammographic evidence of malignancy. A result letter of this screening mammogram will be mailed directly to the patient.  RECOMMENDATION: Screening mammogram in one year. (Code:SM-B-01Y)  BI-RADS CATEGORY  1: Negative.   Electronically Signed   By: Leda Gauze M.D.   On: 03/01/2014 20:19    Jeoffrey Massed, MD  [facility] Pager:[phone]  If 7PM-7AM, please contact night-coverage [email or web address] Password [email or web address] 03/30/2014, 12:41 PM   LOS: 6 days   **Disclaimer: This note may have been dictated with voice recognition software. Similar sounding words can inadvertently be transcribed and this note may contain transcription errors which may not have been corrected upon publication of note.**

## 2014-03-30 NOTE — Progress Notes (Signed)
At rest patient's Sp02 on room air 73%. Placed patient on 2L Sp02 80%. Placed patient on 4L Sp02 88% then slowly rose to 92% over 4 minutes. Pt complained of shortness of breath without oxygen and on ambulation back and forth to bedside commode. Pt on 4L and SOB resolved.

## 2014-03-30 NOTE — Consult Note (Signed)
[facility]    Date of Admission:  03/24/2014    Total days of antibiotics 7        Day 5 levofloxacin               Reason for Consult: No rash of lower extremities    Referring Physician: Dr. Jeoffrey Massed Primary Care Physician: Dr. Windle Guard  Active Problems:   SKIN RASH   PNEUMONIA ORGANISM NOS   C O P D   Altered mental status   Transaminitis   Acute renal failure   Acute respiratory failure   Encephalopathy acute   . antiseptic oral rinse  15 mL Mouth Rinse q12n4p  . aspirin  81 mg Oral Daily  . bisoprolol  5 mg Oral Daily  . chlorhexidine  15 mL Mouth Rinse BID  . furosemide  40 mg Intravenous BID  . ipratropium-albuterol  3 mL Nebulization TID  . levofloxacin  750 mg Oral Q24H  . levothyroxine  50 mcg Oral QAC breakfast  . methylPREDNISolone (SOLU-MEDROL) injection  40 mg Intravenous 3 times per day    Recommendations: 1. 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 I cannot tell which of her medications might be the culprit. Certainly all of her antibiotics or potential causes. She has had one week of antibiotic therapy now and I think it is reasonable to stop levofloxacin now and observe off of antibiotics.    HPI: [name] is a 68 y.o. female with COPD and heart failure who was admitted on July 9 with 4 days of progressive diarrhea and confusion. She was afebrile but had some leukocytosis and was treated empirically for possible pneumonia with vancomycin, piperacillin tazobactam and also treated with metronidazole for possible C. difficile colitis. All cultures have been negative and C. difficile PCR is negative x2. Her antibiotics were to levofloxacin. She is improving. She is no longer confused and her diarrhea has resolved. Her shortness of breath is improving and she has been up walking in the  hall. However 2 days ago she was noted to have a new petechial rash on her lower extremities. It is not pruritic. Her sister thinks the rash may be slightly less erythematous today.   Review of Systems: Constitutional: positive for malaise, negative for anorexia, chills, fevers, sweats and weight loss Eyes: negative Ears, nose, mouth, throat, and face: negative Respiratory: positive for recent worsening of shortness of breath and cough, negative for hemoptysis, pleurisy/chest pain, sputum and wheezing Cardiovascular: negative Gastrointestinal: positive for diarrhea, negative for abdominal pain, nausea and vomiting Genitourinary:negative  Past Medical History  Diagnosis Date  . COPD (chronic obstructive pulmonary disease)   . Hypertension   . CHF (congestive heart failure)     History  Substance Use Topics  . Smoking status: Former Smoker    Quit date: 09/09/2009  . Smokeless tobacco: Not on file  . Alcohol Use: No    No family history on file. Allergies  Allergen Reactions  . Codeine Hives  . Penicillins Other (See Comments)    Unknown     OBJECTIVE: Blood pressure 130/79, pulse 99, temperature 97.9 F (36.6 C), temperature source Oral, resp. rate 18, height 5\' 5"  (1.651 m), weight 166 lb 9.3 oz (75.561 kg), SpO2 92.00%. General: She is alert and in no distress seated in a chair. She  has no memory of the events surrounding her admission Skin: Shows confluent petechial rash on her feet and ankles extending up to the level of the groin. It is not warm or painful to touch. She has ecchymoses over both forearms Eyes: Normal external exam Oral: No oropharyngeal lesions Lungs: Clear Cor: Distant heart sounds Abdomen: Obese, soft and nontender Joints and extremities: Nonpitting edema  Lab Results Lab Results  Component Value Date   WBC 22.1* 03/30/2014   HGB 14.0 03/30/2014   HCT 41.8 03/30/2014   MCV 91.7 03/30/2014   PLT 133* 03/30/2014    Lab Results  Component Value  Date   CREATININE 1.08 03/29/2014   BUN 34* 03/29/2014   NA 139 03/29/2014   K 5.4* 03/29/2014   CL 102 03/29/2014   CO2 25 03/29/2014    Lab Results  Component Value Date   ALT 370* 03/29/2014   AST 25 03/29/2014   ALKPHOS 135* 03/29/2014   BILITOT 0.6 03/29/2014     Microbiology: Recent Results (from the past 240 hour(s))  CULTURE, BLOOD (ROUTINE X 2)     Status: None   Collection Time    03/24/14  6:15 PM      Result Value Ref Range Status   Specimen Description BLOOD ARM RIGHT   Final   Special Requests BOTTLES DRAWN AEROBIC AND ANAEROBIC 10CC   Final   Culture  Setup Time     Final   Value: 03/25/2014 00:42     [facility]   Culture     Final   Value:        BLOOD CULTURE RECEIVED NO GROWTH TO DATE CULTURE WILL BE HELD FOR 5 DAYS BEFORE ISSUING A FINAL NEGATIVE REPORT     [facility]formed at [facility]   Report Status PENDING   Incomplete  CULTURE, BLOOD (ROUTINE X 2)     Status: None   Collection Time    03/24/14  6:40 PM      Result Value Ref Range Status   Specimen Description BLOOD ARM RIGHT   Final   Special Requests BOTTLES DRAWN AEROBIC AND ANAEROBIC 10CC   Final   Culture  Setup Time     Final   Value: 03/25/2014 00:42     [facility]formed at [facility]   Culture     Final   Value:        BLOOD CULTURE RECEIVED NO GROWTH TO DATE CULTURE WILL BE HELD FOR 5 DAYS BEFORE ISSUING A FINAL NEGATIVE REPORT     [facility]formed at [facility]   Report Status PENDING   Incomplete  MRSA PCR SCREENING     Status: None   Collection Time    03/24/14  8:30 PM      Result Value Ref Range Status   MRSA by PCR NEGATIVE  NEGATIVE Final   Comment:            The GeneXpert MRSA Assay (FDA     approved for NASAL specimens     only), is one component of a     comprehensive MRSA colonization     surveillance program. It is not     intended to diagnose MRSA     infection nor to guide or     monitor treatment for     MRSA infections.  URINE CULTURE      Status: None   Collection Time    03/24/14 10:16 PM  Result Value Ref Range Status   Specimen Description URINE, CATHETERIZED   Final   Special Requests Normal   Final   Culture  Setup Time     Final   Value: 03/25/2014 09:13     Performed at [facility]   Colony Count     Final   Value: NO GROWTH     Performed at [facility]   Culture     Final   Value: NO GROWTH     Performed at [facility]   Report Status 03/26/2014 FINAL   Final  STOOL CULTURE     Status: None   Collection Time    03/25/14  2:29 AM      Result Value Ref Range Status   Specimen Description STOOL   Final   Special Requests Normal   Final   Culture     Final   Value: NO SALMONELLA, SHIGELLA, CAMPYLOBACTER, YERSINIA, OR E.COLI 0157:H7 ISOLATED     Performed at [facility]   Report Status 03/28/2014 FINAL   Final  CLOSTRIDIUM DIFFICILE BY PCR     Status: None   Collection Time    03/25/14  2:29 AM      Result Value Ref Range Status   C difficile by pcr NEGATIVE  NEGATIVE Final  CLOSTRIDIUM DIFFICILE BY PCR     Status: None   Collection Time    03/28/14  9:03 AM      Result Value Ref Range Status   C difficile by pcr NEGATIVE  NEGATIVE Final    Cliffton Asters, MD [facility] [phone] pager   [phone] cell 03/30/2014, 1:36 PM

## 2014-03-31 DIAGNOSIS — M7989 Other specified soft tissue disorders: Secondary | ICD-10-CM

## 2014-03-31 LAB — CBC WITH DIFFERENTIAL/PLATELET
BASOS PCT: 0 % (ref 0–1)
Basophils Absolute: 0.1 10*3/uL (ref 0.0–0.1)
EOS PCT: 0 % (ref 0–5)
Eosinophils Absolute: 0 10*3/uL (ref 0.0–0.7)
HCT: 47.8 % — ABNORMAL HIGH (ref 36.0–46.0)
HEMOGLOBIN: 15.5 g/dL — AB (ref 12.0–15.0)
LYMPHS ABS: 0.9 10*3/uL (ref 0.7–4.0)
Lymphocytes Relative: 4 % — ABNORMAL LOW (ref 12–46)
MCH: 30.6 pg (ref 26.0–34.0)
MCHC: 32.4 g/dL (ref 30.0–36.0)
MCV: 94.3 fL (ref 78.0–100.0)
MONOS PCT: 4 % (ref 3–12)
Monocytes Absolute: 0.9 10*3/uL (ref 0.1–1.0)
NEUTROS PCT: 92 % — AB (ref 43–77)
Neutro Abs: 18.7 10*3/uL — ABNORMAL HIGH (ref 1.7–7.7)
PLATELETS: 128 10*3/uL — AB (ref 150–400)
RBC: 5.07 MIL/uL (ref 3.87–5.11)
RDW: 15.1 % (ref 11.5–15.5)
WBC: 20.6 10*3/uL — ABNORMAL HIGH (ref 4.0–10.5)

## 2014-03-31 LAB — CULTURE, BLOOD (ROUTINE X 2)
CULTURE: NO GROWTH
Culture: NO GROWTH

## 2014-03-31 LAB — BASIC METABOLIC PANEL
Anion gap: 15 (ref 5–15)
Anion gap: 13 (ref 5–15)
BUN: 41 mg/dL — ABNORMAL HIGH (ref 6–23)
BUN: 41 mg/dL — AB (ref 6–23)
CALCIUM: 8.4 mg/dL (ref 8.4–10.5)
CO2: 26 mEq/L (ref 19–32)
CO2: 32 mEq/L (ref 19–32)
CREATININE: 1.15 mg/dL — AB (ref 0.50–1.10)
CREATININE: 1.19 mg/dL — AB (ref 0.50–1.10)
Calcium: 8.2 mg/dL — ABNORMAL LOW (ref 8.4–10.5)
Chloride: 99 mEq/L (ref 96–112)
Chloride: 101 mEq/L (ref 96–112)
GFR, EST AFRICAN AMERICAN: 56 mL/min — AB (ref >90)
GFR, EST AFRICAN AMERICAN: 54 mL/min — AB (ref >90)
GFR, EST NON AFRICAN AMERICAN: 48 mL/min — AB (ref >90)
GFR, EST NON AFRICAN AMERICAN: 47 mL/min — AB (ref >90)
GLUCOSE: 135 mg/dL — AB (ref 70–99)
Glucose, Bld: 146 mg/dL — ABNORMAL HIGH (ref 70–99)
Potassium: 5.9 mEq/L — ABNORMAL HIGH (ref 3.7–5.3)
Potassium: 3.9 mEq/L (ref 3.7–5.3)
Sodium: 140 mEq/L (ref 137–147)
Sodium: 146 mEq/L (ref 137–147)

## 2014-03-31 MED ORDER — DEXTROSE 50 % IV SOLN
INTRAVENOUS | Status: AC
  Filled 2014-03-31: qty 50

## 2014-03-31 MED ORDER — SODIUM POLYSTYRENE SULFONATE 15 GM/60ML PO SUSP
30.0000 g | Freq: Once | ORAL | Status: AC
  Administered 2014-03-31: 30 g via ORAL
  Filled 2014-03-31: qty 120

## 2014-03-31 MED ORDER — METHYLPREDNISOLONE SODIUM SUCC 40 MG IJ SOLR
40.0000 mg | Freq: Two times a day (BID) | INTRAMUSCULAR | Status: DC
  Administered 2014-03-31 – 2014-04-02 (×4): 40 mg via INTRAVENOUS
  Filled 2014-03-31 (×6): qty 1

## 2014-03-31 MED ORDER — INSULIN ASPART 100 UNIT/ML ~~LOC~~ SOLN
6.0000 [IU] | Freq: Once | SUBCUTANEOUS | Status: DC
Start: 2014-03-31 — End: 2014-03-31

## 2014-03-31 MED ORDER — INSULIN ASPART 100 UNIT/ML ~~LOC~~ SOLN
6.0000 [IU] | Freq: Once | SUBCUTANEOUS | Status: AC
  Administered 2014-03-31: 6 [IU] via INTRAVENOUS
  Filled 2014-03-31: qty 0.06

## 2014-03-31 MED ORDER — DEXTROSE 50 % IV SOLN
25.0000 mL | Freq: Once | INTRAVENOUS | Status: AC
  Administered 2014-03-31: 25 mL via INTRAVENOUS

## 2014-03-31 MED ORDER — INSULIN ASPART 100 UNIT/ML ~~LOC~~ SOLN: 6.0000 [IU] | Freq: Once | SUBCUTANEOUS | Status: DC

## 2014-03-31 NOTE — Progress Notes (Signed)
Received referral from long length of stay meeting Medical Director for [facility]. Confirmed at bedside with family that patient's Primary Care MD is Dr Jeannetta NapElkins who [facility]. Therefore, [facility] not [facility] at this [facility]. Made inpatient RNCM aware. Raiford NobleAtika Hall, MSN- RN,BSN- [facility] [phone]- [phone]

## 2014-03-31 NOTE — Progress Notes (Signed)
Physical Therapy Treatment Patient Details Name: [name] MRN: [id number] DOB: 10-23-1945 Today's Date: 03/31/2014    History of Present Illness Patient is a 68 yo female admitted 7 9/15 with AMS, weakness, diarrhea.  Patient with CAP, hypotension, ARF.  PMH: CHF, COPD, pna.    PT Comments    Patient reports feeling more weak today. Continues to have decrease in Sa02 with exercise/activity limiting safe mobility. Able to maintain Sa02 with exercises in room. Takes long periods of time for sats to revocer to safe ranges. Reviewed pursed lip breathing with patient - demonstrates difficulty breathing in through nose. 1 LOB during gait training. Updated discharge plan below as pt with worsening balance during gait and concerns for safety going home. Will continue to follow as pt very motivated to work with therapy.   Follow Up Recommendations  SNF;Supervision/Assistance - 24 hour     Equipment Recommendations  None recommended by PT    Recommendations for Other Services       Precautions / Restrictions Precautions Precautions: Fall Precaution Comments: watch sats Restrictions Weight Bearing Restrictions: No    Mobility  Bed Mobility               General bed mobility comments: Sitting in chair upon PT arrival.  Transfers Overall transfer level: Needs assistance Equipment used: Rolling walker (2 wheeled) Transfers: Sit to/from Stand Sit to Stand: Min guard         General transfer comment: VC for hand placement and safety - emphasizing controlled descent into chair. Stood x6 for strengthening and exercise.  Ambulation/Gait Ambulation/Gait assistance: Min assist Ambulation Distance (Feet): 45 Feet (+ 45' with 1 standing rest break.) Assistive device: Rolling walker (2 wheeled) Gait Pattern/deviations: Step-through pattern;Decreased stride length;Trunk flexed   Gait velocity interpretation: Below normal speed for age/gender General Gait Details: Pt with  decreased gait speed. VC for pursed lip breathing with nasal cannula during gait. No LOB requiring Min A to prevent fall. Difficulty breathing in through nose however pt dislikes mask. Sa02 dropped to 77% during ambulation and took 2-3 minutes to recover to high 80s. See vital signs section for details   Stairs            Wheelchair Mobility    Modified Rankin (Stroke Patients Only)       Balance Overall balance assessment: Needs assistance   Sitting balance-Leahy Scale: Good     Standing balance support: Bilateral upper extremity supported;During functional activity Standing balance-Leahy Scale: Poor Standing balance comment: Use of RW for support. 1 LOB to right during end of gait requiring Min A to prevent fall.                    Cognition Arousal/Alertness: Awake/alert Behavior During Therapy: WFL for tasks assessed/performed Overall Cognitive Status: Within Functional Limits for tasks assessed                      Exercises General Exercises - Lower Extremity Ankle Circles/Pumps: Both;15 reps;Seated Long Arc Quad: Both;15 reps;Seated (x2 sets) Hip ABduction/ADduction: Both;15 reps;Seated (x2 sets) Hip Flexion/Marching: Both;10 reps;Seated (x2 sets) Toe Raises: Both;15 reps;Seated Heel Raises: Both;15 reps;Seated    General Comments General comments (skin integrity, edema, etc.): Patient with dyspnea 3/4 with mobility. Patient with difficulty breathing in through nose even with instruction for pursed lip breathing and VC.      Pertinent Vitals/Pain No pain reported. Sa02 at rest on 2.5L 02 [place] ranges from 87-90%, pt asymptomatic. Sa02  decreased to 77% during gait requiring 3 minutes to recover to high 80s and increase from 3L-4L 02. Able to maintain Sa02 in mid-high 80s with there ex in room with long rest breaks between exercise.    Home Living                      Prior Function            PT Goals (current goals can now be found  in the care plan section) Progress towards PT goals: Progressing toward goals    Frequency  Min 2X/week    PT Plan Frequency needs to be updated;Discharge plan needs to be updated    Co-evaluation             End of Session Equipment Utilized During Treatment: Oxygen;Gait belt Activity Tolerance: Other (comment) (Treatment limited by decrease in 02 sats.) Patient left: in chair;with call bell/phone within reach;with chair alarm set;with nursing/sitter in room     Time: 1610-9604 PT Time Calculation (min): 28 min  Charges:  $Gait Training: 8-22 mins $Therapeutic Exercise: 8-22 mins                    G CodesAlvie Heidelberg A 2014-04-06, 11:25 AM Alvie Heidelberg, PT, DPT [phone]

## 2014-03-31 NOTE — Progress Notes (Addendum)
PATIENT DETAILS Name: [name] Age: 68 y.o. Sex: female Date of Birth: 1945/09/24 Admit Date: 03/24/2014 Admitting Physician Alyson ReedyWesam G Yacoub, MD OZH:[id number],IONGEXPCP:ELKINS,WILSON Joelene MillinLIVER, MD  Subjective: No major complaints-O2 down to 3L, rash essentially unchanged  Assessment/Plan: Active Problems:   Altered mental status -Secondary to metabolic encephalopathy-resolved -CT head negative  COPD exacerbation -still hypoxic,but lungs mostly clear-has some bibasilar rales -decrease solumedrol, c/w scheduled nebs. Check BNP, does have underlying chronic diastolic dysfunction-however already on Lasix. Will ask RN to better document strict I&O's, goal is to keep in negative balance  CAP -afebrile, leukocytosis slowly decreasing. All cultures negative so far -Has completed 7 days of IV Antibiotics, completed 5 days of Levaquin, given rash-levaquin-stopped on 03/30/14  Rash -suspected drug rash -seen by ID-Dr Campbell-suspected leukocytoclastic rash secondary to antibiotics-all of which have been discontinued  Acute Hypoxic resp failure -secondary to COPD,PNA -still hypoxic, will check BNP-given underlying diastolic dysfunction -awaitdoppler legs  Leukocytosis -likely leukemoid reaction- see above regarding Abx. Also on steroids. -WBC decreasing, tapering off steroids  Hyperkalemia -on 7/16-?etiology -will treat with IV Insulin/D50/Kayexalate-repeat BMET later today  ARF -resolved -suspect this was pre-renal   Transaminitis -secondary to acute illness, acute hepatitis serology neg -downtrending, Abd Ultrasound neg for acute abnormalities  Diarrhea -resolved -C Diff PCR neg  Thrombocytopenia -secondary to acute illness -improving  Hypothyroidism -c/w Levothyroxine  HTN -c/w Bisoprolol -BP controlled  Disposition: Remain inpatient-suspect will require SNF on discharge  DVT Prophylaxis:  SCD's  Code Status: Full code   Family Communication Sister at bedside  yesterday  Procedures:  None  CONSULTS:  ID  MEDICATIONS: Scheduled Meds: . antiseptic oral rinse  15 mL Mouth Rinse q12n4p  . aspirin  81 mg Oral Daily  . bisoprolol  5 mg Oral Daily  . chlorhexidine  15 mL Mouth Rinse BID  . dextrose  25 mL Intravenous Once  . furosemide  40 mg Intravenous BID  . insulin aspart  6 Units Intravenous Once  . ipratropium-albuterol  3 mL Nebulization TID  . levothyroxine  50 mcg Oral QAC breakfast  . methylPREDNISolone (SOLU-MEDROL) injection  40 mg Intravenous 3 times per day  . sodium polystyrene  30 g Oral Once   Continuous Infusions:  PRN Meds:.ipratropium-albuterol  Antibiotics: Anti-infectives   Start     Dose/Rate Route Frequency Ordered Stop   03/29/14 1315  levofloxacin (LEVAQUIN) tablet 750 mg  Status:  Discontinued     750 mg Oral Every 48 hours 03/28/14 0818 03/28/14 1511   03/28/14 1800  levofloxacin (LEVAQUIN) tablet 750 mg  Status:  Discontinued     750 mg Oral Every 24 hours 03/28/14 1511 03/30/14 1345   03/27/14 2200  vancomycin (VANCOCIN) 1,250 mg in sodium chloride 0.9 % 250 mL IVPB  Status:  Discontinued     1,250 mg 166.7 mL/hr over 90 Minutes Intravenous Every 24 hours 03/27/14 2124 03/29/14 0928   03/27/14 1230  levofloxacin (LEVAQUIN) tablet 250 mg  Status:  Discontinued     250 mg Oral Daily 03/26/14 1224 03/28/14 0818   03/27/14 1000  levofloxacin (LEVAQUIN) IVPB 500 mg  Status:  Discontinued     500 mg 100 mL/hr over 60 Minutes Intravenous Every 48 hours 03/25/14 1005 03/26/14 1156   03/26/14 2100  vancomycin (VANCOCIN) IVPB 1000 mg/200 mL premix  Status:  Discontinued     1,000 mg 200 mL/hr over 60 Minutes Intravenous Every 48 hours 03/25/14 0839 03/25/14 1005   03/26/14 1400  metroNIDAZOLE (FLAGYL) tablet 500 mg  Status:  Discontinued     500 mg Oral 3 times per day 03/26/14 1156 03/29/14 0928   03/26/14 1230  levofloxacin (LEVAQUIN) tablet 500 mg     500 mg Oral Daily 03/26/14 1224 03/26/14 1325   03/26/14  1200  levofloxacin (LEVAQUIN) tablet 500 mg  Status:  Discontinued     500 mg Oral Daily 03/26/14 1156 03/26/14 1223   03/25/14 1100  levofloxacin (LEVAQUIN) IVPB 750 mg     750 mg 100 mL/hr over 90 Minutes Intravenous  Once 03/25/14 1005 03/25/14 1354   03/24/14 1930  aztreonam (AZACTAM) 500 mg in dextrose 5 % 50 mL IVPB  Status:  Discontinued     500 mg 100 mL/hr over 30 Minutes Intravenous 3 times per day 03/24/14 1849 03/25/14 1006   03/24/14 1900  vancomycin (VANCOCIN) IVPB 1000 mg/200 mL premix     1,000 mg 200 mL/hr over 60 Minutes Intravenous  Once 03/24/14 1850 03/24/14 2247   03/24/14 1815  metroNIDAZOLE (FLAGYL) IVPB 500 mg  Status:  Discontinued     500 mg 100 mL/hr over 60 Minutes Intravenous Every 8 hours 03/24/14 1812 03/26/14 1156       PHYSICAL EXAM: Vital signs in last 24 hours: Filed Vitals:   03/30/14 2017 03/30/14 2029 03/31/14 0458 03/31/14 0858  BP: 138/78  98/63   Pulse: 93 81 84 90  Temp: 97.6 F (36.4 C)  97.8 F (36.6 C)   TempSrc: Oral  Oral   Resp: 20 16 16 16   Height:      Weight:   76.2 kg (167 lb 15.9 oz)   SpO2: 90% 94% 98% 97%    Weight change: 0.639 kg (1 lb 6.5 oz) Filed Weights   03/25/14 0459 03/30/14 0445 03/31/14 0458  Weight: 73.3 kg (161 lb 9.6 oz) 75.561 kg (166 lb 9.3 oz) 76.2 kg (167 lb 15.9 oz)   Body mass index is 27.96 kg/(m^2).   Gen Exam: Awake and alert with clear speech.   Neck: Supple, No JVD.   Chest: few bibasilar rales, some scattered rhonchi-moving air b/l CVS: S1 S2 Regular, no murmurs.  Abdomen: soft, BS +, non tender, non distended.  Extremities: no edema, lower extremities warm to touch. Neurologic: Non Focal.   Skin:Shows confluent petechial rash on her feet and ankles extending up to the level of the groin. Wounds: N/A.    Intake/Output from previous day:  Intake/Output Summary (Last 24 hours) at 03/31/14 1038 Last data filed at 03/31/14 0900  Gross per 24 hour  Intake    476 ml  Output    350 ml    Net    126 ml     LAB RESULTS: CBC  Recent Labs Lab 03/24/14 1516 03/24/14 1900 03/24/14 2040  03/27/14 1017 03/28/14 0820 03/29/14 0935 03/30/14 0747 03/31/14 0925  WBC 11.5* QUESTIONABLE RESULTS, RECOMMEND RECOLLECT TO VERIFY 10.0  < > 26.1* 29.2* 34.7* 22.1* 20.6*  HGB 15.4* 10.0* 14.9  < > 14.8 14.8 14.4 14.0 15.5*  HCT 46.1* 29.6* 43.6  < > 44.9 45.9 44.9 41.8 47.8*  PLT 81* 53* 74*  < > 65* 78* 102* 133* 128*  MCV 92.6 93.1 91.4  < > 94.5 94.8 95.3 91.7 94.3  MCH 30.9 31.4 31.2  < > 31.2 30.6 30.6 30.7 30.6  MCHC 33.4 33.8 34.2  < > 33.0 32.2 32.1 33.5 32.4  RDW 13.0 12.8 12.9  < > 14.1 14.6 14.8 15.0 15.1  LYMPHSABS 1.0 QUESTIONABLE RESULTS, RECOMMEND RECOLLECT TO VERIFY 1.4  --   --   --   --  0.2* 0.9  MONOABS 1.0 QUESTIONABLE RESULTS, RECOMMEND RECOLLECT TO VERIFY 0.8  --   --   --   --  0.9 0.9  EOSABS 0.1 QUESTIONABLE RESULTS, RECOMMEND RECOLLECT TO VERIFY 0.0  --   --   --   --  0.0 0.0  BASOSABS 0.0 QUESTIONABLE RESULTS, RECOMMEND RECOLLECT TO VERIFY 0.0  --   --   --   --  0.0 0.1  < > = values in this interval not displayed.  Chemistries   Recent Labs Lab 03/25/14 0316  03/27/14 1017 03/28/14 0820 03/29/14 0740 03/30/14 1317 03/31/14 0728  NA 139  < > 138 138 139 140 140  K 3.9  < > 5.0 5.1 5.4* 4.5 5.9*  CL 94*  < > 101 102 102 97 99  CO2 23  < > 25 23 25 27 26   GLUCOSE 128*  < > 95 141* 160* 142* 146*  BUN 71*  < > 19 24* 34* 39* 41*  CREATININE 3.52*  < > 1.15* 1.03 1.08 1.18* 1.15*  CALCIUM 8.0*  < > 8.7 8.8 8.1* 8.2* 8.2*  MG 2.6*  --   --   --   --   --   --   < > = values in this interval not displayed.  CBG:  Recent Labs Lab 03/25/14 1952 03/26/14 0001 03/26/14 0400 03/26/14 0728 03/26/14 1154  GLUCAP 83 135* 143* 102* 105*    GFR Estimated Creatinine Clearance: 49.2 ml/min (by C-G formula based on Cr of 1.15).  Coagulation profile  Recent Labs Lab 03/24/14 1516 03/24/14 1900 03/26/14 0255  INR 1.78* 2.75* 1.69*     Cardiac Enzymes  Recent Labs Lab 03/26/14 0255 03/26/14 0855  TROPONINI 0.47* 0.30*    No components found with this basename: POCBNP,  No results found for this basename: DDIMER,  in the last 72 hours No results found for this basename: HGBA1C,  in the last 72 hours No results found for this basename: CHOL, HDL, LDLCALC, TRIG, CHOLHDL, LDLDIRECT,  in the last 72 hours No results found for this basename: TSH, T4TOTAL, FREET3, T3FREE, THYROIDAB,  in the last 72 hours No results found for this basename: VITAMINB12, FOLATE, FERRITIN, TIBC, IRON, RETICCTPCT,  in the last 72 hours No results found for this basename: LIPASE, AMYLASE,  in the last 72 hours  Urine Studies No results found for this basename: UACOL, UAPR, USPG, UPH, UTP, UGL, UKET, UBIL, UHGB, UNIT, UROB, ULEU, UEPI, UWBC, URBC, UBAC, CAST, CRYS, UCOM, BILUA,  in the last 72 hours  MICROBIOLOGY: Recent Results (from the past 240 hour(s))  CULTURE, BLOOD (ROUTINE X 2)     Status: None   Collection Time    03/24/14  6:15 PM      Result Value Ref Range Status   Specimen Description BLOOD ARM RIGHT   Final   Special Requests BOTTLES DRAWN AEROBIC AND ANAEROBIC 10CC   Final   Culture  Setup Time     Final   Value: 03/25/2014 00:42     Performed at [facility]   Culture     Final   Value: NO GROWTH 5 DAYS     Performed at [facility]   Report Status 03/31/2014 FINAL   Final  CULTURE, BLOOD (ROUTINE X 2)     Status: None   Collection Time  03/24/14  6:40 PM      Result Value Ref Range Status   Specimen Description BLOOD ARM RIGHT   Final   Special Requests BOTTLES DRAWN AEROBIC AND ANAEROBIC 10CC   Final   Culture  Setup Time     Final   Value: 03/25/2014 00:42     Performed at [facility]   Culture     Final   Value: NO GROWTH 5 DAYS     Performed at [facility]   Report Status 03/31/2014 FINAL   Final  MRSA PCR SCREENING     Status: None   Collection Time    03/24/14   8:30 PM      Result Value Ref Range Status   MRSA by PCR NEGATIVE  NEGATIVE Final   Comment:            The GeneXpert MRSA Assay (FDA     approved for NASAL specimens     only), is one component of a     comprehensive MRSA colonization     surveillance program. It is not     intended to diagnose MRSA     infection nor to guide or     monitor treatment for     MRSA infections.  URINE CULTURE     Status: None   Collection Time    03/24/14 10:16 PM      Result Value Ref Range Status   Specimen Description URINE, CATHETERIZED   Final   Special Requests Normal   Final   Culture  Setup Time     Final   Value: 03/25/2014 09:13     Performed at [facility]   Colony Count     Final   Value: NO GROWTH     Performed at [facility]   Culture     Final   Value: NO GROWTH     Performed at [facility]   Report Status 03/26/2014 FINAL   Final  STOOL CULTURE     Status: None   Collection Time    03/25/14  2:29 AM      Result Value Ref Range Status   Specimen Description STOOL   Final   Special Requests Normal   Final   Culture     Final   Value: NO SALMONELLA, SHIGELLA, CAMPYLOBACTER, YERSINIA, OR E.COLI 0157:H7 ISOLATED     Performed at [facility]   Report Status 03/28/2014 FINAL   Final  CLOSTRIDIUM DIFFICILE BY PCR     Status: None   Collection Time    03/25/14  2:29 AM      Result Value Ref Range Status   C difficile by pcr NEGATIVE  NEGATIVE Final  CLOSTRIDIUM DIFFICILE BY PCR     Status: None   Collection Time    03/28/14  9:03 AM      Result Value Ref Range Status   C difficile by pcr NEGATIVE  NEGATIVE Final    RADIOLOGY STUDIES/RESULTS: Ct Head Wo Contrast  03/24/2014   CLINICAL DATA:  Altered mental status  EXAM: CT HEAD WITHOUT CONTRAST  TECHNIQUE: Contiguous axial images were obtained from the base of the skull through the vertex without intravenous contrast.  COMPARISON:  None.  FINDINGS: Ventricle size is normal. Negative for acute  or chronic infarction. Negative for hemorrhage or fluid collection. Negative for mass or edema. No shift of the midline structures.  Calvarium is intact.  IMPRESSION: Normal   Electronically  Signed   By: Marlan Palau M.D.   On: 03/24/2014 16:59   US Abdomen Complete  03/24/2014   CLINICAL DATA:  Elevated hepatic function studies; history of CHF and hypertension  EXAM: ULTRASOUND ABDOMEN COMPLETE  COMPARISON:  None.  FINDINGS: Gallbladder:  The gallbladder contains multiple echogenic stones. Mobility could not be assessed due to patient's today ste state. No positive sonographic Murphy's sign is reported but again the patient was sedated. There is no wall thickening or pericholecystic fluid.  Common bile duct:  Diameter: Mildly dilated at 7.9 mm.  Liver:  No focal lesion identified. Within normal limits in parenchymal echogenicity. There is no focal mass nor ductal dilation.  IVC:  No abnormality visualized.  Pancreas:  Evaluation of the pancreas is limited by bowel gas.  Spleen:  Size and appearance within normal limits.  Right Kidney:  Length: 9.1 cm. The renal echotexture is approximately equal to that of the adjacent liver. No mass or hydronephrosis visualized.  Left Kidney:  Length: 10.2. Echogenicity within normal limits. There is no hydronephrosis. There are echogenic foci in the lower pole which may reflect stones.  Abdominal aorta:  There is mural calcification with maximal diameter 2.6 cm.  Other findings:  No ascites  IMPRESSION: 1. The study is limited due to the patient's sedated status and body habitus. There are multiple gallstones without evidence of acute cholecystitis. The common bile duct is mildly dilated. 2. The renal cortical echotexture is mildly increased which likely reflects medical renal disease. There may be nonobstructing lower pole stones in the left kidney.   Electronically Signed   By: David  [place]   On: 03/24/2014 18:21   Dg Chest Port 1 View  03/30/2014   CLINICAL DATA:   Hypoxia  EXAM: PORTABLE CHEST - 1 VIEW  COMPARISON:  03/26/2014  FINDINGS: Cardiac shadow is stable. Mild interstitial changes are identified bilaterally. The overall appearance is slightly more prominent given some variation in the films. Stable changes in the right lung base are seen. Small effusions are noted bilaterally. No bony abnormality is seen.  IMPRESSION: Mild increased interstitial changes with small pleural effusions.   Electronically Signed   By: Alcide Clever M.D.   On: 03/30/2014 08:04   Dg Chest Port 1 View  03/26/2014   CLINICAL DATA:  Respiratory failure  EXAM: PORTABLE CHEST - 1 VIEW  COMPARISON:  03/25/2014  FINDINGS: The cardiac shadow is stable. Increasing right basilar atelectasis is noted. Diffuse interstitial changes are again noted. No bony abnormality is seen.  IMPRESSION: Increasing right basilar atelectasis.   Electronically Signed   By: Alcide Clever M.D.   On: 03/26/2014 07:19   Dg Chest Port 1 View  03/25/2014   CLINICAL DATA:  COPD  EXAM: PORTABLE CHEST - 1 VIEW  COMPARISON:  03/24/2014  FINDINGS: Cardiac shadow is stable. The lungs remain hyperinflated. Multiple calcified granulomas as well as interstitial scarring is seen. Patchy infiltrate is noted in the right mid lung which may represent acute infiltrate. No acute bony abnormality is noted.  IMPRESSION: No significant interval change from the prior exam. A portion of the changes bilaterally are chronic in nature.   Electronically Signed   By: Alcide Clever M.D.   On: 03/25/2014 07:26   Dg Chest Port 1 View  03/24/2014   CLINICAL DATA:  Altered mental status.  COPD and CHF  EXAM: PORTABLE CHEST - 1 VIEW  COMPARISON:  10/06/2009  FINDINGS: COPD. Diffusely increased lung markings in the upper and  lower lobes, new since the prior study. This may represent pneumonia or possibly pulmonary edema. Small right pleural effusion  IMPRESSION: COPD. Bilateral airspace disease has developed since the prior study which may represent  pneumonia or edema.   Electronically Signed   By: Marlan Palau M.D.   On: 03/24/2014 15:39   Mm Digital Screening Bilateral  03/01/2014   CLINICAL DATA:  Screening.  EXAM: DIGITAL SCREENING BILATERAL MAMMOGRAM WITH CAD  COMPARISON:  Previous exam(s).  ACR Breast Density Category b: There are scattered areas of fibroglandular density.  FINDINGS: There are no findings suspicious for malignancy. Images were processed with CAD.  IMPRESSION: No mammographic evidence of malignancy. A result letter of this screening mammogram will be mailed directly to the patient.  RECOMMENDATION: Screening mammogram in one year. (Code:SM-B-01Y)  BI-RADS CATEGORY  1: Negative.   Electronically Signed   By: Leda Gauze M.D.   On: 03/01/2014 20:19    Jeoffrey Massed, MD  [facility] Pager:[phone]  If 7PM-7AM, please contact night-coverage [email or web address] Password [email or web address] 03/31/2014, 10:38 AM   LOS: 6 days   **Disclaimer: This note may have been dictated with voice recognition software. Similar sounding words can inadvertently be transcribed and this note may contain transcription errors which may not have been corrected upon publication of note.**

## 2014-03-31 NOTE — Care Management Note (Signed)
    Page 1 of 2   03/31/2014     4:11:42 PM CARE MANAGEMENT NOTE 03/31/2014  Patient:  [name],[name]   Account Number:  [id number]  Date Initiated:  03/25/2014  Documentation initiated by:  [name],[name]  Subjective/Objective Assessment:   Admitted with AMS - diarrhea - dehydration     Action/Plan:   pt eval-rec snf   Anticipated DC Date:  04/01/2014   Anticipated DC Plan:  SKILLED NURSING FACILITY  In-house referral  Clinical Social Worker      DC Associate Professorlanning Services  CM consult      [facility]   Choice offered to / List presented to:  C-5 Sibling        HH arranged  HH-2 PT  HH-1 RN      [facility]   Status of service:  Completed, signed off Medicare Important Message given?  YES (If response is "NO", the following Medicare IM given date fields will be blank) Date Medicare IM given:  03/28/2014 Medicare IM given by:  [name],[name] Date Additional Medicare IM given:  03/31/2014 Additional Medicare IM given by:  [name]  [facility]  Per UR Regulation:  Reviewed for med. necessity/level of care/duration of stay  If discussed at Long Length of Stay Meetings, dates discussed:    Comments:  03/31/14 1609 [name], [name], patient is agreeable to snf, [name] , they will keep on their list until patient goes to snf for sure.  03/30/14 1500 Letha Capeeborah Allura Doepke RN, BSN [phone] NCM informed patient that [facility] not [facility] and she would need to pick another agency, [facility], [facility] , soc will begin 24-48 hrs post dc.  03/29/2014 1700 N[facility] and [facility]. [name] RN CCM Case Mgmt phone [phone]  03/29/2014 1520 NCM spoke to pt and [facility]. States she lives at home with  husband. NCM explained she will need HH RN and PT at home. [facility]. States she has RW, wheelchair and shower chair at home. Lives at home with husband but he is not able to assist at home. Sister, [name]  lives close to pt and will be able to assist at home. Explained to pt oxygen maybe needed for home. States she is hoping she does not need oxygen for home. Waiting final dc recommendations for home. Will recheck oxygen saturation closer to dc. [facility] [name] RN CCM Case Mgmt phone [phone]    Contact:   Bullard,[name] Mother [phone]                  Cox,[name] Sister [phone]

## 2014-03-31 NOTE — Progress Notes (Signed)
*  PRELIMINARY RESULTS* Vascular Ultrasound Bilateral lower extremity venous duplex has been completed.  Preliminary findings: Right:  No evidence of DVT, superficial thrombosis, or Baker's cyst. Left: No evidence of DVT or Baker's cyst. Positive for superficial thrombosis of the GSV localized to the distal thigh area.   Jyla Hopf FRANCES 03/31/2014, 1:22 PM

## 2014-03-31 NOTE — Clinical Social Work Note (Signed)
Bed offers given.  Bryant Kshawn Canal MSW, LCSWA, LCASA, [phone] 

## 2014-03-31 NOTE — Clinical Social Work Placement (Addendum)
Clinical Social Work Department CLINICAL SOCIAL WORK PLACEMENT NOTE 03/31/2014  Patient:  [name],[name]  Account Number:  [id number] Admit date:  03/24/2014  Clinical Social Worker:  Cherre BlancJOSEPH BRYANT CAMPBELL, [place]  Date/time:  03/31/2014 02:30 PM  Clinical Social Work is seeking post-discharge placement for this patient at the following level of care:   SKILLED NURSING   (*CSW will update this form in Epic as items are completed)   03/31/2014  Patient/family provided with [facility] of Clinical Social Work's list of facilities offering this level of care within the geographic area requested by the patient (or if unable, by the patient's family).  03/31/2014  Patient/family informed of their freedom to choose among providers that offer the needed level of care, that participate in Medicare, Medicaid or managed care program needed by the patient, have an available bed and are willing to accept the patient.  03/31/2014  Patient/family informed of [facility]' ownership interest in [facility], as well as of the fact that they are under no obligation to receive care at this facility.  PASARR submitted to EDS on 03/31/2014 PASARR number received on 03/31/2014  FL2 transmitted to all facilities in geographic area requested by pt/family on  03/31/2014 FL2 transmitted to all facilities within larger geographic area on   Patient informed that his/her managed care company has contracts with or will negotiate with  certain facilities, including the following:     Patient/family informed of bed offers received:  03/31/14 [facility] Physician recommends and patient chooses bed at    Patient to be transferred to  on  04/02/14 Patient to be transferred to facility by PTAR-Kenslie Abbruzzese Patrick-Jefferson, LCSWA Patient and family notified of transfer on 04/02/14 Name of family member notified: Jess BartersLisa Bullard (daughter) Armstead PeaksMargaret Cox (sister)  The following  physician request were entered in Epic:   Additional Comments:   Roddie McBryant Campbell MSW, [place], [place], [phone]

## 2014-03-31 NOTE — Clinical Social Work Psychosocial (Signed)
Clinical Social Work Department BRIEF PSYCHOSOCIAL ASSESSMENT 03/31/2014  Patient:  [name], [name]     Account Number:  [id number]     Admit date:  03/24/2014  Clinical Social Worker:  Lovey Newcomer  Date/Time:  03/31/2014 10:30 AM  Referred by:  Physician  Date Referred:  03/31/2014 Referred for  SNF Placement   Other Referral:   Interview type:  Patient Other interview type:   Patient alert and oriented at time of assessment.    PSYCHOSOCIAL DATA Living Status:  HUSBAND Admitted from facility:   Level of care:   Primary support name:  Franki Monte Primary support relationship to patient:  SIBLING Degree of support available:   Support is good.    CURRENT CONCERNS Current Concerns  Post-Acute Placement   Other Concerns:    SOCIAL WORK ASSESSMENT / PLAN CSW met with patient at bedside to discuss MD's recommendation for SNF placement before returning home. At first patient was ambivalent about SNF placement but decided that she would want to follow the MD's recommendation. Patient states that she lives with her husband at home and would want to discuss this with her family before making a definite decision. Patient is agreeable to a SNF search and states that she would have preference for [facility] or [facility] CSW explained SNF search/placement process and answered patient's questions.   Assessment/plan status:  Psychosocial Support/Ongoing Assessment of Needs Other assessment/ plan:   COmplete FL2, Fax, PASRR   Information/referral to community resources:   CSW contact information and SNF list given.    PATIENT'S/FAMILY'S RESPONSE TO PLAN OF CARE: [name] search and possible placement. CSW will follow up with bed offers.       Liz Beach MSW, [place], [place], [phone]

## 2014-03-31 NOTE — Progress Notes (Signed)
Dear Doctor: Ghimire This patient has been identified as a candidate for PICC for the following reason (s): IV therapy over 48 hours and poor veins/poor circulatory system (CHF, COPD, emphysema, diabetes, steroid use, IV drug abuse, etc.) If you agree, please write an order for the indicated device. For any questions contact the Vascular Access Team at [phone] if no answer, please leave a message.  Thank you for supporting the early vascular access assessment program. 

## 2014-04-01 ENCOUNTER — Inpatient Hospital Stay (HOSPITAL_COMMUNITY): Payer: Medicare Other

## 2014-04-01 DIAGNOSIS — M7989 Other specified soft tissue disorders: Secondary | ICD-10-CM

## 2014-04-01 DIAGNOSIS — M79609 Pain in unspecified limb: Secondary | ICD-10-CM

## 2014-04-01 LAB — CBC WITH DIFFERENTIAL/PLATELET
BASOS ABS: 0 10*3/uL (ref 0.0–0.1)
BASOS PCT: 0 % (ref 0–1)
EOS ABS: 0 10*3/uL (ref 0.0–0.7)
EOS PCT: 0 % (ref 0–5)
HCT: 45.4 % (ref 36.0–46.0)
Hemoglobin: 14.7 g/dL (ref 12.0–15.0)
Lymphocytes Relative: 7 % — ABNORMAL LOW (ref 12–46)
Lymphs Abs: 1.1 10*3/uL (ref 0.7–4.0)
MCH: 30.4 pg (ref 26.0–34.0)
MCHC: 32.4 g/dL (ref 30.0–36.0)
MCV: 94 fL (ref 78.0–100.0)
Monocytes Absolute: 0.5 10*3/uL (ref 0.1–1.0)
Monocytes Relative: 3 % (ref 3–12)
NEUTROS PCT: 90 % — AB (ref 43–77)
Neutro Abs: 13.4 10*3/uL — ABNORMAL HIGH (ref 1.7–7.7)
PLATELETS: 140 10*3/uL — AB (ref 150–400)
RBC: 4.83 MIL/uL (ref 3.87–5.11)
RDW: 15 % (ref 11.5–15.5)
WBC: 15 10*3/uL — ABNORMAL HIGH (ref 4.0–10.5)

## 2014-04-01 LAB — COMPREHENSIVE METABOLIC PANEL
ALBUMIN: 2.6 g/dL — AB (ref 3.5–5.2)
ALT: 169 U/L — AB (ref 0–35)
AST: 35 U/L (ref 0–37)
Alkaline Phosphatase: 103 U/L (ref 39–117)
Anion gap: 11 (ref 5–15)
BUN: 37 mg/dL — ABNORMAL HIGH (ref 6–23)
CO2: 33 mEq/L — ABNORMAL HIGH (ref 19–32)
Calcium: 7.7 mg/dL — ABNORMAL LOW (ref 8.4–10.5)
Chloride: 100 mEq/L (ref 96–112)
Creatinine, Ser: 1.05 mg/dL (ref 0.50–1.10)
GFR calc Af Amer: 63 mL/min — ABNORMAL LOW (ref >90)
GFR calc non Af Amer: 54 mL/min — ABNORMAL LOW (ref >90)
Glucose, Bld: 154 mg/dL — ABNORMAL HIGH (ref 70–99)
POTASSIUM: 3.4 meq/L — AB (ref 3.7–5.3)
SODIUM: 144 meq/L (ref 137–147)
TOTAL PROTEIN: 5.3 g/dL — AB (ref 6.0–8.3)
Total Bilirubin: 0.7 mg/dL (ref 0.3–1.2)

## 2014-04-01 MED ORDER — ALBUTEROL SULFATE (2.5 MG/3ML) 0.083% IN NEBU: 2.5000 mg | INHALATION_SOLUTION | RESPIRATORY_TRACT | Status: DC | PRN

## 2014-04-01 MED ORDER — FUROSEMIDE 40 MG PO TABS: 60.0000 mg | ORAL_TABLET | Freq: Two times a day (BID) | ORAL | Status: AC

## 2014-04-01 MED ORDER — RIVAROXABAN 20 MG PO TABS: 20.0000 mg | ORAL_TABLET | Freq: Every day | ORAL | Status: DC

## 2014-04-01 MED ORDER — RIVAROXABAN 15 MG PO TABS: 15.0000 mg | ORAL_TABLET | Freq: Two times a day (BID) | ORAL | Status: DC

## 2014-04-01 MED ORDER — FUROSEMIDE 10 MG/ML IJ SOLN
40.0000 mg | Freq: Every day | INTRAMUSCULAR | Status: DC
  Administered 2014-04-02: 40 mg via INTRAVENOUS
  Filled 2014-04-01: qty 4

## 2014-04-01 MED ORDER — ASPIRIN 81 MG PO CHEW: 324.0000 mg | CHEWABLE_TABLET | Freq: Every day | ORAL | Status: DC

## 2014-04-01 MED ORDER — IPRATROPIUM-ALBUTEROL 0.5-2.5 (3) MG/3ML IN SOLN: 3.0000 mL | Freq: Three times a day (TID) | RESPIRATORY_TRACT | Status: DC

## 2014-04-01 MED ORDER — FLUTICASONE-SALMETEROL 250-50 MCG/DOSE IN AEPB: 1.0000 | INHALATION_SPRAY | Freq: Two times a day (BID) | RESPIRATORY_TRACT | Status: DC

## 2014-04-01 MED ORDER — RIVAROXABAN 15 MG PO TABS
15.0000 mg | ORAL_TABLET | Freq: Two times a day (BID) | ORAL | Status: DC
  Administered 2014-04-01 – 2014-04-02 (×2): 15 mg via ORAL
  Filled 2014-04-01 (×4): qty 1

## 2014-04-01 MED ORDER — PREDNISONE 10 MG PO TABS: ORAL_TABLET | ORAL | Status: DC

## 2014-04-01 MED ORDER — RIVAROXABAN (XARELTO) EDUCATION KIT FOR DVT/PE PATIENTS
PACK | Freq: Once | Status: AC
  Administered 2014-04-01: 17:00:00
  Filled 2014-04-01: qty 1

## 2014-04-01 NOTE — Progress Notes (Signed)
Pt currently 95% on RA. Will continue to monitor.

## 2014-04-01 NOTE — Progress Notes (Addendum)
PATIENT DETAILS Name: [name] Age: 68 y.o. Sex: female Date of Birth: Mar 17, 1946 Admit Date: 03/24/2014 Admitting Physician Lindsay Reedy, MD WUJ:[id number] Lindsay Millin, MD  Subjective: No major complaints-now on room air, rash essentially unchanged-but somewhat better on the right leg  Assessment/Plan: Active Problems:   Altered mental status -Secondary to metabolic encephalopathy-resolved -CT head negative  COPD exacerbation -significantly better, not hypoxic on room air this am,but lungs mostly clear -decrease solumedrol-and transition to oral prednisone, c/w scheduled nebs.  CAP -afebrile, leukocytosis slowly decreasing. All cultures negative so far -Has completed 7 days of IV Antibiotics, completed 5 days of Levaquin, given rash-levaquin-stopped on 03/30/14  Rash -suspected drug rash -seen by ID-[name]-suspected leukocytoclastic rash secondary to antibiotics-all of which have been discontinued. Rash essentially unchanged, with perhaps some mild improvement in the left leg.Will need Dermatology follow up as outpatient  Acute Hypoxic resp failure -secondary to COPD,PNA -no longer hypoxic-now on room air -lower ext doppler-negative for DVT-but positive for SVT in the Left leg. On ASA.  Addendum -Upper ext Doppler-+DVT-options for anticoagulation discussed, NOAC's vs Coumadin discussed. Bleeding risks discussed. Note daughter at bedside during this discussion. Patient at this time, prefers to be on Xarelto. Given persistent hypoxia, could have Pul Embolism, patient has a very small IV, offered to order a CT Angio chest, but at this time patient prefers not to get "stuck" again to get another IV access. Suspect we can just treat for 3-6 months, given clinical stability, suspect getting a CT Angio will not change the current management.  Leukocytosis -likely leukemoid reaction- see above regarding Abx. Also on steroids. -WBC decreasing, tapering off  steroids  Hyperkalemia -on 7/16-?etiology - treateed with IV Insulin/D50/Kayexalate-resolved  ARF -resolved -suspect this was pre-renal   Transaminitis -secondary to acute illness, acute hepatitis serology neg -downtrending, Abd Ultrasound neg for acute abnormalities, but does have cholelithiasis-suspect this is chronic and not related to transaminitis  Diarrhea -resolved -C Diff PCR neg  Thrombocytopenia -secondary to acute illness -improving-platelets up to 140 K today  Hypothyroidism -c/w Levothyroxine  HTN -c/w Bisoprolol -BP controlled  Disposition: Remain inpatient- SNF on discharge  DVT Prophylaxis:  SCD's  Code Status: Full code   Family Communication Sister at bedside yesterday  Procedures:  None  CONSULTS:  ID  MEDICATIONS: Scheduled Meds: . antiseptic oral rinse  15 mL Mouth Rinse q12n4p  . aspirin  81 mg Oral Daily  . bisoprolol  5 mg Oral Daily  . chlorhexidine  15 mL Mouth Rinse BID  . furosemide  40 mg Intravenous BID  . ipratropium-albuterol  3 mL Nebulization TID  . levothyroxine  50 mcg Oral QAC breakfast  . methylPREDNISolone (SOLU-MEDROL) injection  40 mg Intravenous Q12H   Continuous Infusions:  PRN Meds:.ipratropium-albuterol  Antibiotics: Anti-infectives   Start     Dose/Rate Route Frequency Ordered Stop   03/29/14 1315  levofloxacin (LEVAQUIN) tablet 750 mg  Status:  Discontinued     750 mg Oral Every 48 hours 03/28/14 0818 03/28/14 1511   03/28/14 1800  levofloxacin (LEVAQUIN) tablet 750 mg  Status:  Discontinued     750 mg Oral Every 24 hours 03/28/14 1511 03/30/14 1345   03/27/14 2200  vancomycin (VANCOCIN) 1,250 mg in sodium chloride 0.9 % 250 mL IVPB  Status:  Discontinued     1,250 mg 166.7 mL/hr over 90 Minutes Intravenous Every 24 hours 03/27/14 2124 03/29/14 0928   03/27/14 1230  levofloxacin (LEVAQUIN) tablet 250 mg  Status:  Discontinued     250 mg Oral Daily 03/26/14 1224 03/28/14 0818   03/27/14 1000   levofloxacin (LEVAQUIN) IVPB 500 mg  Status:  Discontinued     500 mg 100 mL/hr over 60 Minutes Intravenous Every 48 hours 03/25/14 1005 03/26/14 1156   03/26/14 2100  vancomycin (VANCOCIN) IVPB 1000 mg/200 mL premix  Status:  Discontinued     1,000 mg 200 mL/hr over 60 Minutes Intravenous Every 48 hours 03/25/14 0839 03/25/14 1005   03/26/14 1400  metroNIDAZOLE (FLAGYL) tablet 500 mg  Status:  Discontinued     500 mg Oral 3 times per day 03/26/14 1156 03/29/14 0928   03/26/14 1230  levofloxacin (LEVAQUIN) tablet 500 mg     500 mg Oral Daily 03/26/14 1224 03/26/14 1325   03/26/14 1200  levofloxacin (LEVAQUIN) tablet 500 mg  Status:  Discontinued     500 mg Oral Daily 03/26/14 1156 03/26/14 1223   03/25/14 1100  levofloxacin (LEVAQUIN) IVPB 750 mg     750 mg 100 mL/hr over 90 Minutes Intravenous  Once 03/25/14 1005 03/25/14 1354   03/24/14 1930  aztreonam (AZACTAM) 500 mg in dextrose 5 % 50 mL IVPB  Status:  Discontinued     500 mg 100 mL/hr over 30 Minutes Intravenous 3 times per day 03/24/14 1849 03/25/14 1006   03/24/14 1900  vancomycin (VANCOCIN) IVPB 1000 mg/200 mL premix     1,000 mg 200 mL/hr over 60 Minutes Intravenous  Once 03/24/14 1850 03/24/14 2247   03/24/14 1815  metroNIDAZOLE (FLAGYL) IVPB 500 mg  Status:  Discontinued     500 mg 100 mL/hr over 60 Minutes Intravenous Every 8 hours 03/24/14 1812 03/26/14 1156       PHYSICAL EXAM: Vital signs in last 24 hours: Filed Vitals:   03/31/14 2101 04/01/14 0521 04/01/14 0947 04/01/14 0950  BP:  133/78  114/57  Pulse: 75 74  86  Temp:  98.3 F (36.8 C)    TempSrc:  Oral    Resp: 18 18    Height:      Weight:  78.7 kg (173 lb 8 oz)    SpO2: 94% 94% 95%     Weight change: 2.5 kg (5 lb 8.2 oz) Filed Weights   03/30/14 0445 03/31/14 0458 04/01/14 0521  Weight: 75.561 kg (166 lb 9.3 oz) 76.2 kg (167 lb 15.9 oz) 78.7 kg (173 lb 8 oz)   Body mass index is 28.87 kg/(m^2).   Gen Exam: Awake and alert with clear speech.    Neck: Supple, No JVD.   Chest: b/l clear CVS: S1 S2 Regular, no murmurs.  Abdomen: soft, BS +, non tender, non distended.  Extremities: no edema, lower extremities warm to touch. Neurologic: Non Focal.   Skin:Shows confluent petechial rash on her feet and ankles extending up to the level of the groin. Wounds: N/A.    Intake/Output from previous day:  Intake/Output Summary (Last 24 hours) at 04/01/14 1016 Last data filed at 04/01/14 0920  Gross per 24 hour  Intake    600 ml  Output    450 ml  Net    150 ml     LAB RESULTS: CBC  Recent Labs Lab 03/28/14 0820 03/29/14 0935 03/30/14 0747 03/31/14 0925 04/01/14 0503  WBC 29.2* 34.7* 22.1* 20.6* 15.0*  HGB 14.8 14.4 14.0 15.5* 14.7  HCT 45.9 44.9 41.8 47.8* 45.4  PLT 78* 102* 133* 128* 140*  MCV 94.8 95.3 91.7 94.3 94.0  MCH 30.6  30.6 30.7 30.6 30.4  MCHC 32.2 32.1 33.5 32.4 32.4  RDW 14.6 14.8 15.0 15.1 15.0  LYMPHSABS  --   --  0.2* 0.9 1.1  MONOABS  --   --  0.9 0.9 0.5  EOSABS  --   --  0.0 0.0 0.0  BASOSABS  --   --  0.0 0.1 0.0    Chemistries   Recent Labs Lab 03/29/14 0740 03/30/14 1317 03/31/14 0728 03/31/14 1430 04/01/14 0503  NA 139 140 140 146 144  K 5.4* 4.5 5.9* 3.9 3.4*  CL 102 97 99 101 100  CO2 25 27 26  32 33*  GLUCOSE 160* 142* 146* 135* 154*  BUN 34* 39* 41* 41* 37*  CREATININE 1.08 1.18* 1.15* 1.19* 1.05  CALCIUM 8.1* 8.2* 8.2* 8.4 7.7*    CBG:  Recent Labs Lab 03/25/14 1952 03/26/14 0001 03/26/14 0400 03/26/14 0728 03/26/14 1154  GLUCAP 83 135* 143* 102* 105*    GFR Estimated Creatinine Clearance: 54.7 ml/min (by C-G formula based on Cr of 1.05).  Coagulation profile  Recent Labs Lab 03/26/14 0255  INR 1.69*    Cardiac Enzymes  Recent Labs Lab 03/26/14 0255 03/26/14 0855  TROPONINI 0.47* 0.30*    No components found with this basename: POCBNP,  No results found for this basename: DDIMER,  in the last 72 hours No results found for this basename: HGBA1C,   in the last 72 hours No results found for this basename: CHOL, HDL, LDLCALC, TRIG, CHOLHDL, LDLDIRECT,  in the last 72 hours No results found for this basename: TSH, T4TOTAL, FREET3, T3FREE, THYROIDAB,  in the last 72 hours No results found for this basename: VITAMINB12, FOLATE, FERRITIN, TIBC, IRON, RETICCTPCT,  in the last 72 hours No results found for this basename: LIPASE, AMYLASE,  in the last 72 hours  Urine Studies No results found for this basename: UACOL, UAPR, USPG, UPH, UTP, UGL, UKET, UBIL, UHGB, UNIT, UROB, ULEU, UEPI, UWBC, URBC, UBAC, CAST, CRYS, UCOM, BILUA,  in the last 72 hours  MICROBIOLOGY: Recent Results (from the past 240 hour(s))  CULTURE, BLOOD (ROUTINE X 2)     Status: None   Collection Time    03/24/14  6:15 PM      Result Value Ref Range Status   Specimen Description BLOOD ARM RIGHT   Final   Special Requests BOTTLES DRAWN AEROBIC AND ANAEROBIC 10CC   Final   Culture  Setup Time     Final   Value: 03/25/2014 00:42     Performed at [facility]   Culture     Final   Value: NO GROWTH 5 DAYS     Performed at [facility]   Report Status 03/31/2014 FINAL   Final  CULTURE, BLOOD (ROUTINE X 2)     Status: None   Collection Time    03/24/14  6:40 PM      Result Value Ref Range Status   Specimen Description BLOOD ARM RIGHT   Final   Special Requests BOTTLES DRAWN AEROBIC AND ANAEROBIC 10CC   Final   Culture  Setup Time     Final   Value: 03/25/2014 00:42     Performed at [facility]   Culture     Final   Value: NO GROWTH 5 DAYS     Performed at [facility]   Report Status 03/31/2014 FINAL   Final  MRSA PCR SCREENING     Status: None   Collection Time  03/24/14  8:30 PM      Result Value Ref Range Status   MRSA by PCR NEGATIVE  NEGATIVE Final   Comment:            The GeneXpert MRSA Assay (FDA     approved for NASAL specimens     only), is one component of a     comprehensive MRSA colonization     surveillance  program. It is not     intended to diagnose MRSA     infection nor to guide or     monitor treatment for     MRSA infections.  URINE CULTURE     Status: None   Collection Time    03/24/14 10:16 PM      Result Value Ref Range Status   Specimen Description URINE, CATHETERIZED   Final   Special Requests Normal   Final   Culture  Setup Time     Final   Value: 03/25/2014 09:13     [facility]   Colony Count     Final   Value: NO GROWTH     [facility]   Culture     Final   Value: NO GROWTH     [facility]   Report Status 03/26/2014 FINAL   Final  STOOL CULTURE     Status: None   Collection Time    03/25/14  2:29 AM      Result Value Ref Range Status   Specimen Description STOOL   Final   Special Requests Normal   Final   Culture     Final   Value: NO SALMONELLA, SHIGELLA, CAMPYLOBACTER, YERSINIA, OR E.COLI 0157:H7 ISOLATED     [facility]   Report Status 03/28/2014 FINAL   Final  CLOSTRIDIUM DIFFICILE BY PCR     Status: None   Collection Time    03/25/14  2:29 AM      Result Value Ref Range Status   C difficile by pcr NEGATIVE  NEGATIVE Final  CLOSTRIDIUM DIFFICILE BY PCR     Status: None   Collection Time    03/28/14  9:03 AM      Result Value Ref Range Status   C difficile by pcr NEGATIVE  NEGATIVE Final    RADIOLOGY STUDIES/RESULTS: Ct Head Wo Contrast  03/24/2014   CLINICAL DATA:  Altered mental status  EXAM: CT HEAD WITHOUT CONTRAST  TECHNIQUE: Contiguous axial images were obtained from the base of the skull through the vertex without intravenous contrast.  COMPARISON:  None.  FINDINGS: Ventricle size is normal. Negative for acute or chronic infarction. Negative for hemorrhage or fluid collection. Negative for mass or edema. No shift of the midline structures.  Calvarium is intact.  IMPRESSION: Normal   Electronically Signed   By: Marlan Palauharles  Clark M.D.   On: 03/24/2014 16:59   [place]  Abdomen Complete  03/24/2014   CLINICAL DATA:  Elevated hepatic function studies; history of CHF and hypertension  EXAM: ULTRASOUND ABDOMEN COMPLETE  COMPARISON:  None.  FINDINGS: Gallbladder:  The gallbladder contains multiple echogenic stones. Mobility could not be assessed due to patient's today ste state. No positive sonographic Murphy's sign is reported but again the patient was sedated. There is no wall thickening or pericholecystic fluid.  Common bile duct:  Diameter: Mildly dilated at 7.9 mm.  Liver:  No focal lesion identified. Within normal limits in parenchymal echogenicity. There is no  focal mass nor ductal dilation.  IVC:  No abnormality visualized.  Pancreas:  Evaluation of the pancreas is limited by bowel gas.  Spleen:  Size and appearance within normal limits.  Right Kidney:  Length: 9.1 cm. The renal echotexture is approximately equal to that of the adjacent liver. No mass or hydronephrosis visualized.  Left Kidney:  Length: 10.2. Echogenicity within normal limits. There is no hydronephrosis. There are echogenic foci in the lower pole which may reflect stones.  Abdominal aorta:  There is mural calcification with maximal diameter 2.6 cm.  Other findings:  No ascites  IMPRESSION: 1. The study is limited due to the patient's sedated status and body habitus. There are multiple gallstones without evidence of acute cholecystitis. The common bile duct is mildly dilated. 2. The renal cortical echotexture is mildly increased which likely reflects medical renal disease. There may be nonobstructing lower pole stones in the left kidney.   Electronically Signed   By: David  [place]   On: 03/24/2014 18:21   Dg Chest Port 1 View  03/30/2014   CLINICAL DATA:  Hypoxia  EXAM: PORTABLE CHEST - 1 VIEW  COMPARISON:  03/26/2014  FINDINGS: Cardiac shadow is stable. Mild interstitial changes are identified bilaterally. The overall appearance is slightly more prominent given some variation in the films. Stable changes in  the right lung base are seen. Small effusions are noted bilaterally. No bony abnormality is seen.  IMPRESSION: Mild increased interstitial changes with small pleural effusions.   Electronically Signed   By: Alcide Clever M.D.   On: 03/30/2014 08:04   Dg Chest Port 1 View  03/26/2014   CLINICAL DATA:  Respiratory failure  EXAM: PORTABLE CHEST - 1 VIEW  COMPARISON:  03/25/2014  FINDINGS: The cardiac shadow is stable. Increasing right basilar atelectasis is noted. Diffuse interstitial changes are again noted. No bony abnormality is seen.  IMPRESSION: Increasing right basilar atelectasis.   Electronically Signed   By: Alcide Clever M.D.   On: 03/26/2014 07:19   Dg Chest Port 1 View  03/25/2014   CLINICAL DATA:  COPD  EXAM: PORTABLE CHEST - 1 VIEW  COMPARISON:  03/24/2014  FINDINGS: Cardiac shadow is stable. The lungs remain hyperinflated. Multiple calcified granulomas as well as interstitial scarring is seen. Patchy infiltrate is noted in the right mid lung which may represent acute infiltrate. No acute bony abnormality is noted.  IMPRESSION: No significant interval change from the prior exam. A portion of the changes bilaterally are chronic in nature.   Electronically Signed   By: Alcide Clever M.D.   On: 03/25/2014 07:26   Dg Chest Port 1 View  03/24/2014   CLINICAL DATA:  Altered mental status.  COPD and CHF  EXAM: PORTABLE CHEST - 1 VIEW  COMPARISON:  10/06/2009  FINDINGS: COPD. Diffusely increased lung markings in the upper and lower lobes, new since the prior study. This may represent pneumonia or possibly pulmonary edema. Small right pleural effusion  IMPRESSION: COPD. Bilateral airspace disease has developed since the prior study which may represent pneumonia or edema.   Electronically Signed   By: Marlan Palau M.D.   On: 03/24/2014 15:39   Mm Digital Screening Bilateral  03/01/2014   CLINICAL DATA:  Screening.  EXAM: DIGITAL SCREENING BILATERAL MAMMOGRAM WITH CAD  COMPARISON:  Previous exam(s).  ACR  Breast Density Category b: There are scattered areas of fibroglandular density.  FINDINGS: There are no findings suspicious for malignancy. Images were processed with CAD.  IMPRESSION: No mammographic  evidence of malignancy. A result letter of this screening mammogram will be mailed directly to the patient.  RECOMMENDATION: Screening mammogram in one year. (Code:SM-B-01Y)  BI-RADS CATEGORY  1: Negative.   Electronically Signed   By: Leda Gauze M.D.   On: 03/01/2014 20:19    Jeoffrey Massed, MD  [facility] Pager:[phone]  If 7PM-7AM, please contact night-coverage [email or web address] Password [email or web address] 04/01/2014, 10:16 AM   LOS: 6 days   **Disclaimer: This note may have been dictated with voice recognition software. Similar sounding words can inadvertently be transcribed and this note may contain transcription errors which may not have been corrected upon publication of note.**

## 2014-04-01 NOTE — Progress Notes (Signed)
Pt's stats low after using BSC. Pt currently on 2L 02. Will recheck once pt comes back from vascular lab.

## 2014-04-01 NOTE — Progress Notes (Signed)
MD on call notified that patient had a 6 beat run of Vtach and heart rate jumped up greater than 135 non-sustained.  Patient denies discomfort and/or chest pain.  No new orders received.  VS WNL for patient; Will continue to monitor patient.

## 2014-04-01 NOTE — Progress Notes (Signed)
Patient ID: [name], female   DOB: 02-23-1946, 68 y.o.   MRN: [id number]         [facility]    Date of Admission:  03/24/2014       Active Problems:   SKIN RASH   PNEUMONIA ORGANISM NOS   C O P D   Altered mental status   Transaminitis   Acute renal failure   Acute respiratory failure   Encephalopathy acute   . antiseptic oral rinse  15 mL Mouth Rinse q12n4p  . aspirin  81 mg Oral Daily  . bisoprolol  5 mg Oral Daily  . chlorhexidine  15 mL Mouth Rinse BID  . furosemide  40 mg Intravenous BID  . levothyroxine  50 mcg Oral QAC breakfast  . methylPREDNISolone (SOLU-MEDROL) injection  40 mg Intravenous Q12H  . rivaroxaban   Does not apply Once  . Rivaroxaban  15 mg Oral BID WC  . [START ON 04/23/2014] rivaroxaban  20 mg Oral Q supper    Subjective: She is tired today but otherwise feeling better.  Objective: Temp:  [98.1 F (36.7 C)-98.3 F (36.8 C)] 98.1 F (36.7 C) (07/17 1259) Pulse Rate:  [74-90] 81 (07/17 1259) Resp:  [16-18] 16 (07/17 1259) BP: (114-139)/(57-78) 139/67 mmHg (07/17 1259) SpO2:  [83 %-95 %] 91 % (07/17 1306) Weight:  [78.7 kg (173 lb 8 oz)] 78.7 kg (173 lb 8 oz) (07/17 0521)  General: She is sitting up in a chair talking with her sister Skin: Extensive ecchymoses on arms. Vasculitic rash on lower extremities is slightly better Lungs: Decreased breath sounds in bases Cor: Regular S1 and S2 with no murmurs  Lab Results Lab Results  Component Value Date   WBC 15.0* 04/01/2014   HGB 14.7 04/01/2014   HCT 45.4 04/01/2014   MCV 94.0 04/01/2014   PLT 140* 04/01/2014    Lab Results  Component Value Date   CREATININE 1.05 04/01/2014   BUN 37* 04/01/2014   NA 144 04/01/2014   K 3.4* 04/01/2014   CL 100 04/01/2014   CO2 33* 04/01/2014    Lab Results  Component Value Date   ALT 169* 04/01/2014   AST 35 04/01/2014   ALKPHOS 103 04/01/2014   BILITOT 0.7 04/01/2014      Microbiology: Recent Results (from the past 240  hour(s))  CULTURE, BLOOD (ROUTINE X 2)     Status: None   Collection Time    03/24/14  6:15 PM      Result Value Ref Range Status   Specimen Description BLOOD ARM RIGHT   Final   Special Requests BOTTLES DRAWN AEROBIC AND ANAEROBIC 10CC   Final   Culture  Setup Time     Final   Value: 03/25/2014 00:42     Performed at [facility]   Culture     Final   Value: NO GROWTH 5 DAYS     Performed at [facility]   Report Status 03/31/2014 FINAL   Final  CULTURE, BLOOD (ROUTINE X 2)     Status: None   Collection Time    03/24/14  6:40 PM      Result Value Ref Range Status   Specimen Description BLOOD ARM RIGHT   Final   Special Requests BOTTLES DRAWN AEROBIC AND ANAEROBIC 10CC   Final   Culture  Setup Time     Final   Value: 03/25/2014 00:42     Performed at [facility]  Culture     Final   Value: NO GROWTH 5 DAYS     [facility]   Report Status 03/31/2014 FINAL   Final  MRSA PCR SCREENING     Status: None   Collection Time    03/24/14  8:30 PM      Result Value Ref Range Status   MRSA by PCR NEGATIVE  NEGATIVE Final   Comment:            The GeneXpert MRSA Assay (FDA     approved for NASAL specimens     only), is one component of a     comprehensive MRSA colonization     surveillance program. It is not     intended to diagnose MRSA     infection nor to guide or     monitor treatment for     MRSA infections.  URINE CULTURE     Status: None   Collection Time    03/24/14 10:16 PM      Result Value Ref Range Status   Specimen Description URINE, CATHETERIZED   Final   Special Requests Normal   Final   Culture  Setup Time     Final   Value: 03/25/2014 09:13     [facility]formed [facility]   Colony Count     Final   Value: NO GROWTH     [facility]   Culture     Final   Value: NO GROWTH     [facility]formed at [facility]   Report Status 03/26/2014 FINAL   Final  STOOL CULTURE     Status: None    Collection Time    03/25/14  2:29 AM      Result Value Ref Range Status   Specimen Description STOOL   Final   Special Requests Normal   Final   Culture     Final   Value: NO SALMONELLA, SHIGELLA, CAMPYLOBACTER, YERSINIA, OR E.COLI 0157:H7 ISOLATED     [facility]formed at [facility]   Report Status 03/28/2014 FINAL   Final  CLOSTRIDIUM DIFFICILE BY PCR     Status: None   Collection Time    03/25/14  2:29 AM      Result Value Ref Range Status   C difficile by pcr NEGATIVE  NEGATIVE Final  CLOSTRIDIUM DIFFICILE BY PCR     Status: None   Collection Time    03/28/14  9:03 AM      Result Value Ref Range Status   C difficile by pcr NEGATIVE  NEGATIVE Final    Studies/Results: Dg Chest Port 1 View  04/01/2014   CLINICAL DATA:  Shortness of breath and cough.  EXAM: PORTABLE CHEST - 1 VIEW  COMPARISON:  03/30/2014 .  FINDINGS: Mediastinum and hilar structures are normal. Heart size and pulmonary vascularity normal. Interstitial infiltrates have partially cleared suggesting clearing pneumonitis and/or interstitial edema. Pleural parenchymal parenchymal thickening noted bilaterally consistent with scarring. Small pleural effusions. No pneumothorax. No acute osseous abnormality.  IMPRESSION: 1. Interim partial clearing of interstitial infiltrates. 2. Small bilateral pleural effusions.   Electronically Signed   By: Maisie Fushomas  Register   On: 04/01/2014 08:09    Assessment: I see no signs of active infection and the rash of her lower extremities is fading.  Plan: 1. Continue observation off of antibiotics 2. Please call if I can be of further assistance  Cliffton AstersJohn Lenda Baratta, MD [facility]  [facility] [phone] pager   [phone] cell 04/01/2014, 2:29 PM

## 2014-04-01 NOTE — Progress Notes (Signed)
Bilateral upper extremity venous duplex completed.  Right:  Superficial thrombus noted in the cephalic vein.  Left:  DVT noted in the axillary vein with superficial thrombus noted in the basilic and antecubital communicating veins.

## 2014-04-01 NOTE — Clinical Social Work Note (Signed)
Per MD patient should be ready for DC on Saturday. [facility] is prepared to accept patient over the weekend. Report left for weekend CSW.  Roddie McBryant Andra Matsuo MSW, [place]CSWA, [place], [phone]

## 2014-04-01 NOTE — Progress Notes (Signed)
ANTICOAGULATION CONSULT NOTE - Initial Consult  Pharmacy Consult for xarelto Indication: DVT  Allergies  Allergen Reactions  . Codeine Hives  . Penicillins Other (See Comments)    Unknown     Patient Measurements: Height: 5' 5"  (165.1 cm) Weight: 173 lb 8 oz (78.7 kg) IBW/kg (Calculated) : 57   Vital Signs: Temp: 98.1 F (36.7 C) (07/17 1259) Temp src: Oral (07/17 1259) BP: 139/67 mmHg (07/17 1259) Pulse Rate: 81 (07/17 1259)  Labs:  Recent Labs  03/30/14 0747  03/31/14 0728 03/31/14 0925 03/31/14 1430 04/01/14 0503  HGB 14.0  --   --  15.5*  --  14.7  HCT 41.8  --   --  47.8*  --  45.4  PLT 133*  --   --  128*  --  140*  CREATININE  --   < > 1.15*  --  1.19* 1.05  < > = values in this interval not displayed.  Estimated Creatinine Clearance: 54.7 ml/min (by C-G formula based on Cr of 1.05).   Medical History: Past Medical History  Diagnosis Date  . COPD (chronic obstructive pulmonary disease)   . Hypertension   . CHF (congestive heart failure)     Medications:  Prescriptions prior to admission  Medication Sig Dispense Refill  . acetaminophen-codeine (TYLENOL #3) 300-30 MG per tablet Take 1-2 tablets by mouth every 4 (four) hours as needed (pain, coughing,diarrhea).      . bisoprolol (ZEBETA) 10 MG tablet Take 10 mg by mouth daily.      Marland Kitchen levothyroxine (SYNTHROID, LEVOTHROID) 50 MCG tablet Take 50 mcg by mouth daily before breakfast.      . mirtazapine (REMERON) 30 MG tablet Take 30 mg by mouth at bedtime.      . pentoxifylline (TRENTAL) 400 MG CR tablet Take 400 mg by mouth daily.      . potassium chloride (K-DUR) 10 MEQ tablet Take 10 mEq by mouth daily.      . simvastatin (ZOCOR) 20 MG tablet Take 20 mg by mouth daily.      Marland Kitchen VITAMIN D, CHOLECALCIFEROL, PO Take 1 tablet by mouth every 14 (fourteen) days.      . [DISCONTINUED] Fluticasone-Salmeterol (ADVAIR) 250-50 MCG/DOSE AEPB Inhale 1 puff into the lungs daily as needed (COPD).       . [DISCONTINUED]  furosemide (LASIX) 40 MG tablet Take 40 mg by mouth 2 (two) times daily.        Assessment: 69 yo F to start xarelto per pharmacy for new RUE DVT.   Wt 78.7kg, creat 1.05, Hg 14.7, plct low at 140 but pt w/ thrombocytopenia this admission secondary to acute illness. PLTC improving to 140K.   Goal of Therapy:  Treat VTE Monitor platelets by anticoagulation protocol: Yes   Plan:  -xarelto 15 mg po BID with meals x 21 days, then on Saturday August 8th, change to xarelto 20 mg qday at supper x 6 months -xarelto d/c kit with 30 day free card ordered for pt  Eudelia Bunch, Pharm.D. [phone] 04/01/2014 2:03 PM

## 2014-04-01 NOTE — Discharge Summary (Addendum)
PATIENT DETAILS Name: [name] Age: 68 y.o. Sex: female Date of Birth: 09/24/1945 MRN: [id number]. Admit Date: 03/24/2014 Discharge Date:04/02/14 Admitting Physician: Alyson Reedy, MD QVZ:DGLOVF,IEPPIR [name], MD  Recommendations for Outpatient Follow-up:  1. Please arrange follow up Dermatology if rash continues to persist 2. Please repeat BMET in 5 days 3. Please repeat CBC in 1 week 4. Upper Ext DVT positive-started on Xarelto-please have patient see hematology prior to discontinuing anticoagulation.  PRIMARY DISCHARGE DIAGNOSIS:  Active Problems:   PNEUMONIA ORGANISM NOS   C O P D   SKIN RASH   Altered mental status   Transaminitis   Acute renal failure   Acute respiratory failure   Encephalopathy acute   Left Upper Ext DVT   COPD exacerbation   SIR'S      PAST MEDICAL HISTORY: Past Medical History  Diagnosis Date  . COPD (chronic obstructive pulmonary disease)   . Hypertension   . CHF (congestive heart failure)     DISCHARGE MEDICATIONS:   Medication List         acetaminophen-codeine 300-30 MG per tablet  Commonly known as:  TYLENOL #3  Take 1-2 tablets by mouth every 4 (four) hours as needed (pain, coughing,diarrhea).     albuterol (2.5 MG/3ML) 0.083% nebulizer solution  Commonly known as:  PROVENTIL  Take 3 mLs (2.5 mg total) by nebulization every 2 (two) hours as needed for wheezing or shortness of breath.     bisoprolol 10 MG tablet  Commonly known as:  ZEBETA  Take 10 mg by mouth daily.     Fluticasone-Salmeterol 250-50 MCG/DOSE Aepb  Commonly known as:  ADVAIR  Inhale 1 puff into the lungs 2 (two) times daily.     furosemide 40 MG tablet  Commonly known as:  LASIX  Take 1.5 tablets (60 mg total) by mouth 2 (two) times daily.     ipratropium-albuterol 0.5-2.5 (3) MG/3ML Soln  Commonly known as:  DUONEB  Take 3 mLs by nebulization 3 (three) times daily.     levothyroxine 50 MCG tablet  Commonly known as:  SYNTHROID, LEVOTHROID    Take 50 mcg by mouth daily before breakfast.     mirtazapine 30 MG tablet  Commonly known as:  REMERON  Take 30 mg by mouth at bedtime.     pentoxifylline 400 MG CR tablet  Commonly known as:  TRENTAL  Take 400 mg by mouth daily.     potassium chloride 10 MEQ tablet  Commonly known as:  K-DUR  Take 10 mEq by mouth daily.     predniSONE 10 MG tablet  Commonly known as:  DELTASONE  - Take 4 tablets (40 mg) daily for 2 days, then,  - Take 3 tablets (30 mg) daily for 2 days, then,  - Take 2 tablets (20 mg) daily for 2 days, then,  - Take 1 tablets (10 mg) daily for 1 days, then stop     Rivaroxaban 15 MG Tabs tablet  Commonly known as:  XARELTO  Take 1 tablet (15 mg total) by mouth 2 (two) times daily with a meal.     rivaroxaban 20 MG Tabs tablet  Commonly known as:  XARELTO  Take 1 tablet (20 mg total) by mouth daily with supper.  Start taking on:  04/23/2014     simvastatin 20 MG tablet  Commonly known as:  ZOCOR  Take 20 mg by mouth daily.     VITAMIN D (CHOLECALCIFEROL) PO  Take 1 tablet by mouth every  14 (fourteen) days.        ALLERGIES:   Allergies  Allergen Reactions  . Codeine Hives  . Penicillins Other (See Comments)    Unknown     BRIEF HPI:  See H&P, Labs, Consult and Test reports for all details in brief, 68 year old female with history of CHF and COPD presenting via EMS after her daughter found her confused at home. Upon EMS arrival patient was diaphoretic and clammy. Evidently has had diarrhea that started 4 days prior to admission , has not been eating or drinking but continued to take her medications including lasix and potassium. Also found to acute resp failure.   CONSULTATIONS:   pulmonary/intensive care  Renal  PERTINENT RADIOLOGIC STUDIES: Ct Head Wo Contrast  03/24/2014   CLINICAL DATA:  Altered mental status  EXAM: CT HEAD WITHOUT CONTRAST  TECHNIQUE: Contiguous axial images were obtained from the base of the skull through the vertex  without intravenous contrast.  COMPARISON:  None.  FINDINGS: Ventricle size is normal. Negative for acute or chronic infarction. Negative for hemorrhage or fluid collection. Negative for mass or edema. No shift of the midline structures.  Calvarium is intact.  IMPRESSION: Normal   Electronically Signed   By: Marlan Palauharles  Clark M.D.   On: 03/24/2014 16:59   [place] Abdomen Complete  03/24/2014   CLINICAL DATA:  Elevated hepatic function studies; history of CHF and hypertension  EXAM: ULTRASOUND ABDOMEN COMPLETE  COMPARISON:  None.  FINDINGS: Gallbladder:  The gallbladder contains multiple echogenic stones. Mobility could not be assessed due to patient's today ste state. No positive sonographic Murphy's sign is reported but again the patient was sedated. There is no wall thickening or pericholecystic fluid.  Common bile duct:  Diameter: Mildly dilated at 7.9 mm.  Liver:  No focal lesion identified. Within normal limits in parenchymal echogenicity. There is no focal mass nor ductal dilation.  IVC:  No abnormality visualized.  Pancreas:  Evaluation of the pancreas is limited by bowel gas.  Spleen:  Size and appearance within normal limits.  Right Kidney:  Length: 9.1 cm. The renal echotexture is approximately equal to that of the adjacent liver. No mass or hydronephrosis visualized.  Left Kidney:  Length: 10.2. Echogenicity within normal limits. There is no hydronephrosis. There are echogenic foci in the lower pole which may reflect stones.  Abdominal aorta:  There is mural calcification with maximal diameter 2.6 cm.  Other findings:  No ascites  IMPRESSION: 1. The study is limited due to the patient's sedated status and body habitus. There are multiple gallstones without evidence of acute cholecystitis. The common bile duct is mildly dilated. 2. The renal cortical echotexture is mildly increased which likely reflects medical renal disease. There may be nonobstructing lower pole stones in the left kidney.   Electronically  Signed   By: David  SwazilandJordan   On: 03/24/2014 18:21   Dg Chest Port 1 View  04/01/2014   CLINICAL DATA:  Shortness of breath and cough.  EXAM: PORTABLE CHEST - 1 VIEW  COMPARISON:  03/30/2014 .  FINDINGS: Mediastinum and hilar structures are normal. Heart size and pulmonary vascularity normal. Interstitial infiltrates have partially cleared suggesting clearing pneumonitis and/or interstitial edema. Pleural parenchymal parenchymal thickening noted bilaterally consistent with scarring. Small pleural effusions. No pneumothorax. No acute osseous abnormality.  IMPRESSION: 1. Interim partial clearing of interstitial infiltrates. 2. Small bilateral pleural effusions.   Electronically Signed   By: Maisie Fushomas  Register   On: 04/01/2014 08:09  Dg Chest Port 1 View  03/30/2014   CLINICAL DATA:  Hypoxia  EXAM: PORTABLE CHEST - 1 VIEW  COMPARISON:  03/26/2014  FINDINGS: Cardiac shadow is stable. Mild interstitial changes are identified bilaterally. The overall appearance is slightly more prominent given some variation in the films. Stable changes in the right lung base are seen. Small effusions are noted bilaterally. No bony abnormality is seen.  IMPRESSION: Mild increased interstitial changes with small pleural effusions.   Electronically Signed   By: Alcide Clever M.D.   On: 03/30/2014 08:04   Dg Chest Port 1 View  03/26/2014   CLINICAL DATA:  Respiratory failure  EXAM: PORTABLE CHEST - 1 VIEW  COMPARISON:  03/25/2014  FINDINGS: The cardiac shadow is stable. Increasing right basilar atelectasis is noted. Diffuse interstitial changes are again noted. No bony abnormality is seen.  IMPRESSION: Increasing right basilar atelectasis.   Electronically Signed   By: Alcide Clever M.D.   On: 03/26/2014 07:19   Dg Chest Port 1 View  03/25/2014   CLINICAL DATA:  COPD  EXAM: PORTABLE CHEST - 1 VIEW  COMPARISON:  03/24/2014  FINDINGS: Cardiac shadow is stable. The lungs remain hyperinflated. Multiple calcified granulomas as well as  interstitial scarring is seen. Patchy infiltrate is noted in the right mid lung which may represent acute infiltrate. No acute bony abnormality is noted.  IMPRESSION: No significant interval change from the prior exam. A portion of the changes bilaterally are chronic in nature.   Electronically Signed   By: Alcide Clever M.D.   On: 03/25/2014 07:26   Dg Chest Port 1 View  03/24/2014   CLINICAL DATA:  Altered mental status.  COPD and CHF  EXAM: PORTABLE CHEST - 1 VIEW  COMPARISON:  10/06/2009  FINDINGS: COPD. Diffusely increased lung markings in the upper and lower lobes, new since the prior study. This may represent pneumonia or possibly pulmonary edema. Small right pleural effusion  IMPRESSION: COPD. Bilateral airspace disease has developed since the prior study which may represent pneumonia or edema.   Electronically Signed   By: Marlan Palau M.D.   On: 03/24/2014 15:39     PERTINENT LAB RESULTS: CBC:  Recent Labs  03/31/14 0925 04/01/14 0503  WBC 20.6* 15.0*  HGB 15.5* 14.7  HCT 47.8* 45.4  PLT 128* 140*   CMET CMP     Component Value Date/Time   NA 144 04/01/2014 0503   K 3.4* 04/01/2014 0503   CL 100 04/01/2014 0503   CO2 33* 04/01/2014 0503   GLUCOSE 154* 04/01/2014 0503   BUN 37* 04/01/2014 0503   CREATININE 1.05 04/01/2014 0503   CALCIUM 7.7* 04/01/2014 0503   PROT 5.3* 04/01/2014 0503   ALBUMIN 2.6* 04/01/2014 0503   AST 35 04/01/2014 0503   ALT 169* 04/01/2014 0503   ALKPHOS 103 04/01/2014 0503   BILITOT 0.7 04/01/2014 0503   GFRNONAA 54* 04/01/2014 0503   GFRAA 63* 04/01/2014 0503    GFR Estimated Creatinine Clearance: 54.7 ml/min (by C-G formula based on Cr of 1.05). No results found for this basename: LIPASE, AMYLASE,  in the last 72 hours No results found for this basename: CKTOTAL, CKMB, CKMBINDEX, TROPONINI,  in the last 72 hours No components found with this basename: POCBNP,  No results found for this basename: DDIMER,  in the last 72 hours No results found for this  basename: HGBA1C,  in the last 72 hours No results found for this basename: CHOL, HDL, LDLCALC, TRIG, CHOLHDL, LDLDIRECT,  in the last 72 hours No results found for this basename: TSH, T4TOTAL, FREET3, T3FREE, THYROIDAB,  in the last 72 hours No results found for this basename: VITAMINB12, FOLATE, FERRITIN, TIBC, IRON, RETICCTPCT,  in the last 72 hours Coags: No results found for this basename: PT, INR,  in the last 72 hours Microbiology: Recent Results (from the past 240 hour(s))  CULTURE, BLOOD (ROUTINE X 2)     Status: None   Collection Time    03/24/14  6:15 PM      Result Value Ref Range Status   Specimen Description BLOOD ARM RIGHT   Final   Special Requests BOTTLES DRAWN AEROBIC AND ANAEROBIC 10CC   Final   Culture  Setup Time     Final   Value: 03/25/2014 00:42     Performed at [facility]   Culture     Final   Value: NO GROWTH 5 DAYS     Performed at [facility]   Report Status 03/31/2014 FINAL   Final  CULTURE, BLOOD (ROUTINE X 2)     Status: None   Collection Time    03/24/14  6:40 PM      Result Value Ref Range Status   Specimen Description BLOOD ARM RIGHT   Final   Special Requests BOTTLES DRAWN AEROBIC AND ANAEROBIC 10CC   Final   Culture  Setup Time     Final   Value: 03/25/2014 00:42     Performed at [facility]   Culture     Final   Value: NO GROWTH 5 DAYS     Performed at [facility]   Report Status 03/31/2014 FINAL   Final  MRSA PCR SCREENING     Status: None   Collection Time    03/24/14  8:30 PM      Result Value Ref Range Status   MRSA by PCR NEGATIVE  NEGATIVE Final   Comment:            The GeneXpert MRSA Assay (FDA     approved for NASAL specimens     only), is one component of a     comprehensive MRSA colonization     surveillance program. It is not     intended to diagnose MRSA     infection nor to guide or     monitor treatment for     MRSA infections.  URINE CULTURE     Status: None   Collection Time     03/24/14 10:16 PM      Result Value Ref Range Status   Specimen Description URINE, CATHETERIZED   Final   Special Requests Normal   Final   Culture  Setup Time     Final   Value: 03/25/2014 09:13     Performed at [facility]   Colony Count     Final   Value: NO GROWTH     Performed at [facility]   Culture     Final   Value: NO GROWTH     Performed at [facility]   Report Status 03/26/2014 FINAL   Final  STOOL CULTURE     Status: None   Collection Time    03/25/14  2:29 AM      Result Value Ref Range Status   Specimen Description STOOL   Final   Special Requests Normal   Final   Culture     Final   Value: NO SALMONELLA, SHIGELLA,  CAMPYLOBACTER, YERSINIA, OR E.COLI 0157:H7 ISOLATED     Performed at [facility]   Report Status 03/28/2014 FINAL   Final  CLOSTRIDIUM DIFFICILE BY PCR     Status: None   Collection Time    03/25/14  2:29 AM      Result Value Ref Range Status   C difficile by pcr NEGATIVE  NEGATIVE Final  CLOSTRIDIUM DIFFICILE BY PCR     Status: None   Collection Time    03/28/14  9:03 AM      Result Value Ref Range Status   C difficile by pcr NEGATIVE  NEGATIVE Final     BRIEF HOSPITAL COURSE:   Active Problems: Altered mental status  -Secondary to metabolic encephalopathy-resolved  -CT head negative  COPD exacerbation  -significantly better, with IV Steroids, nebs and IV antibitoics. By day of discharge, not hypoxic on room air this am, lungs clear on exam -Will stopl-and transition to oral prednisone, c/w scheduled nebs on discharge  SIRS -secondary to PNA -resolved with supportive Care  CAP  -afebrile, leukocytosis slowly decreasing. All cultures negative so far  -Has completed 7 days of IV Antibiotics, completed 5 days of Levaquin, given rash-levaquin-stopped on 03/30/14  Acute Hypoxic resp failure  -secondary to COPD,PNA , and acute diastolic CHF -no longer hypoxic-now on room air  -lower ext doppler-negative  for DVT-but positive for SVT in the Left leg. On ASA.Spoke with Dr Dickson-[facility] on call over the phone-since SVT in the distal thigh and not near the sapheno-femoral junction no need for anticoagulation, he recommends just ASA.However patient has Left upper ext DVT-will be started on Xarelto  Acute Diastolic CHF -much more compensated, CXR done on 7/17-improved.Transition to oral Lasix.  -will need close monitoring of lytes as outpatient   Left Upper Ext DVT -patient will be started on Xarelto on 7/17, please note, developed significantly worsened left upper arm swelling on 7/17-underwent Doppler Upper ext-positive for DVT noted in the axillary vein with superficial thrombus noted in the basilic and antecubital communicating veins. Also has Sup vein thrombosis in right cephalic vein -Options for anticoagulation discussed, NOAC's vs Coumadin discussed. Bleeding risks discussed. Note daughter at bedside during this discussion. Patient at this time, prefers to be on Xarelto. Given persistent hypoxia, could have Pul Embolism, patient has a very small IV, offered to order a CT Angio chest, but at this time patient prefers not to get "stuck" again to get another IV access. Suspect we can just treat for 3-6 months, given clinical stability, suspect getting a CT Angio will not change the current management. -Patient will require hematology evaluation prior to discontinuation of anticoagulation  Leukocytosis  -likely leukemoid reaction- see above regarding Abx. Also on steroids.  -WBC decreasing, tapering off steroids   Hyperkalemia  -on 7/16-?etiology  - treated with IV Insulin/D50/Kayexalate-resolved   ARF  -resolved  -suspect this was pre-renal  -seen by renal during her stay here  Transaminitis  -secondary to acute illness, acute hepatitis serology neg  -downtrending, Abd Ultrasound neg for acute abnormalities, but does have cholelithiasis-suspect this is chronic and not related to transaminitis     Diarrhea  -resolved  -C Diff PCR neg   Thrombocytopenia  -secondary to acute illness  -improving-platelets up to 140 K today   Hypothyroidism  -c/w Levothyroxine   HTN  -c/w Bisoprolol  -BP controlled  TODAY-DAY OF DISCHARGE:  Subjective:   Berta Minor today has no headache,no chest abdominal pain,no new weakness tingling or numbness, feels much  better  Objective:   Blood pressure 139/67, pulse 81, temperature 98.1 F (36.7 C), temperature source Oral, resp. rate 16, height 5\' 5"  (1.651 m), weight 78.7 kg (173 lb 8 oz), SpO2 91.00%.  Intake/Output Summary (Last 24 hours) at 04/01/14 1506 Last data filed at 04/01/14 0920  Gross per 24 hour  Intake    360 ml  Output    200 ml  Net    160 ml   Filed Weights   03/30/14 0445 03/31/14 0458 04/01/14 0521  Weight: 75.561 kg (166 lb 9.3 oz) 76.2 kg (167 lb 15.9 oz) 78.7 kg (173 lb 8 oz)    Exam Awake Alert, Oriented *3, No new F.N deficits, Normal affect [place].AT,PERRAL Supple Neck,No JVD, No cervical lymphadenopathy appriciated.  Symmetrical Chest wall movement, Good air movement bilaterally, CTAB RRR,No Gallops,Rubs or new Murmurs, No Parasternal Heave +ve B.Sounds, Abd Soft, Non tender, No organomegaly appriciated, No rebound -guarding or rigidity. No Cyanosis, Clubbing or edema, No new Rash or bruise  DISCHARGE CONDITION: Stable  DISPOSITION: SNF  DISCHARGE INSTRUCTIONS:    Activity:  As tolerated with Full fall precautions use walker/cane & assistance as needed  Diet recommendation: Heart Healthy diet Fluid restriction 1.5 lit/day  Discharge Instructions   (HEART FAILURE PATIENTS) Call MD:  Anytime you have any of the following symptoms: 1) 3 pound weight gain in 24 hours or 5 pounds in 1 week 2) shortness of breath, with or without a dry hacking cough 3) swelling in the hands, feet or stomach 4) if you have to sleep on extra pillows at night in order to breathe.    Complete by:  As directed      Call MD  for:  difficulty breathing, headache or visual disturbances    Complete by:  As directed      Diet - low sodium heart healthy    Complete by:  As directed      Increase activity slowly    Complete by:  As directed            Follow-up Information   Follow up with Kaleen Mask, MD. Schedule an appointment as soon as possible for a visit in 1 week. (after discharge from SNF)    Specialty:  Family Medicine   Contact information:   [address] [place] [address] [phone]       Follow up with [facility] Schedule an appointment as soon as possible for a visit in 1 week. (please make appointment with a MD of your choice)    Contact information:   [address] [place] [address] [phone]     Total Time spent on discharge equals 45 minutes.  SignedJeoffrey Massed 04/01/2014 3:06 PM  **Disclaimer: This note may have been dictated with voice recognition software. Similar sounding words can inadvertently be transcribed and this note may contain transcription errors which may not have been corrected upon publication of note.**

## 2014-04-01 NOTE — Progress Notes (Signed)
Patient had a 7 beat run of Vtach.  MD notified.  Denies CP.  VS WNL for patient.  New order for mag draw placed.  Will continue to monitor patient.

## 2014-04-01 NOTE — Discharge Instructions (Signed)
Information on my medicine - XARELTO (rivaroxaban)  This medication education was reviewed with me or my healthcare representative as part of my discharge preparation.  The pharmacist that spoke with me during my hospital stay was:  Herby AbrahamBell, Jaja Switalski T, [facility] InstituteRPH  WHY WAS XARELTO PRESCRIBED FOR YOU? Xarelto was prescribed to treat blood clots that may have been found in the veins of your legs (deep vein thrombosis) or in your lungs (pulmonary embolism) and to reduce the risk of them occurring again.  What do you need to know about Xarelto? The starting dose is one 15 mg tablet taken TWICE daily with food for the FIRST 21 DAYS then on Saturday August 8th the dose is changed to one 20 mg tablet taken ONCE A DAY with your evening meal.  DO NOT stop taking Xarelto without talking to the health care provider who prescribed the medication.  Refill your prescription for 20 mg tablets before you run out.  After discharge, you should have regular check-up appointments with your healthcare provider that is prescribing your Xarelto.  In the future your dose may need to be changed if your kidney function changes by a significant amount.  What do you do if you miss a dose? If you are taking Xarelto TWICE DAILY and you miss a dose, take it as soon as you remember. You may take two 15 mg tablets (total 30 mg) at the same time then resume your regularly scheduled 15 mg twice daily the next day.  If you are taking Xarelto ONCE DAILY and you miss a dose, take it as soon as you remember on the same day then continue your regularly scheduled once daily regimen the next day. Do not take two doses of Xarelto at the same time.   Important Safety Information Xarelto is a blood thinner medicine that can cause bleeding. You should call your healthcare provider right away if you experience any of the following:   Bleeding from an injury or your nose that does not stop.   Unusual colored urine (red or dark brown) or  unusual colored stools (red or black).   Unusual bruising for unknown reasons.   A serious fall or if you hit your head (even if there is no bleeding).  Some medicines may interact with Xarelto and might increase your risk of bleeding while on Xarelto. To help avoid this, consult your healthcare provider or pharmacist prior to using any new prescription or non-prescription medications, including herbals, vitamins, non-steroidal anti-inflammatory drugs (NSAIDs) and supplements.  This website has more information on Xarelto: [email or web address].xarelto.[email or web address].

## 2014-04-02 LAB — MAGNESIUM: MAGNESIUM: 1.9 mg/dL (ref 1.5–2.5)

## 2014-04-02 MED ORDER — MAGNESIUM SULFATE 40 MG/ML IJ SOLN
2.0000 g | Freq: Once | INTRAMUSCULAR | Status: DC
  Filled 2014-04-02: qty 50

## 2014-04-02 NOTE — Progress Notes (Signed)
04/02/14  Patient discharged to [facility] today. IV site removed and discharge instructions reviewed with daughter.

## 2014-04-02 NOTE — Clinical Social Work Note (Signed)
CSW made aware patient ready for d/c to [facility]. CSW contacted facility and confirmed bed availability. CSW met with patient who was alert and oriented x4 and daughter Lattie Haw) who was present at bedside. CSW discussed d/c plan with patient and daughter, both are agreeable to plan. CSW additionally contacted patient's sister Joycelyn Schmid) and made her aware of patient d/c to [facility] today. CSW faxed d/c summary to facility and provided RN Bailey Mech) with number to call for report. CSW to arrange transportation via [place]. No further needs. CSW signing off.   [place], [place] Weekend Clinical Social Worker [phone]

## 2014-04-04 ENCOUNTER — Non-Acute Institutional Stay (SKILLED_NURSING_FACILITY): Payer: Medicare Other | Admitting: Internal Medicine

## 2014-04-04 ENCOUNTER — Encounter: Payer: Self-pay | Admitting: Internal Medicine

## 2014-04-04 DIAGNOSIS — R197 Diarrhea, unspecified: Secondary | ICD-10-CM

## 2014-04-04 DIAGNOSIS — R6510 Systemic inflammatory response syndrome (SIRS) of non-infectious origin without acute organ dysfunction: Secondary | ICD-10-CM

## 2014-04-04 DIAGNOSIS — J9600 Acute respiratory failure, unspecified whether with hypoxia or hypercapnia: Secondary | ICD-10-CM

## 2014-04-04 DIAGNOSIS — I82622 Acute embolism and thrombosis of deep veins of left upper extremity: Secondary | ICD-10-CM

## 2014-04-04 DIAGNOSIS — N179 Acute kidney failure, unspecified: Secondary | ICD-10-CM

## 2014-04-04 DIAGNOSIS — I10 Essential (primary) hypertension: Secondary | ICD-10-CM

## 2014-04-04 DIAGNOSIS — I82629 Acute embolism and thrombosis of deep veins of unspecified upper extremity: Secondary | ICD-10-CM

## 2014-04-04 DIAGNOSIS — J189 Pneumonia, unspecified organism: Secondary | ICD-10-CM

## 2014-04-04 DIAGNOSIS — G9340 Encephalopathy, unspecified: Secondary | ICD-10-CM

## 2014-04-04 DIAGNOSIS — I5031 Acute diastolic (congestive) heart failure: Secondary | ICD-10-CM

## 2014-04-04 DIAGNOSIS — E039 Hypothyroidism, unspecified: Secondary | ICD-10-CM

## 2014-04-04 NOTE — Progress Notes (Signed)
MRN: [id number] Name: [name]  Sex: female Age: 68 y.o. DOB: [birth year]  PSC #: [facility] Facility/Room: 108 Level Of Care: SNF Provider: Merrilee Seashore D Emergency Contacts: Extended Emergency Contact Information Primary Emergency Contact: Bullard,[name] Address: [address]          [place], [place] Home Phone: [phone] Work Phone: [phone] Relation: Mother Secondary Emergency Contact: Cox,[name]  [place] Home Phone: [phone] Relation: Sister  Code Status: DNR  Allergies: Codeine and Penicillins  Chief Complaint  Patient presents with  . nursing home admission    HPI: Patient is 68 y.o. female who had SIRS and PNA with resp failure admitted to SNF for OT/PT 2/2 gen weakness.  Past Medical History  Diagnosis Date  . COPD (chronic obstructive pulmonary disease)   . Hypertension   . CHF (congestive heart failure)     History reviewed. No pertinent past surgical history.    Medication List       This list is accurate as of: 04/04/14 11:59 PM.  Always use your most recent med list.               acetaminophen-codeine 300-30 MG per tablet  Commonly known as:  TYLENOL #3  Take 1-2 tablets by mouth every 4 (four) hours as needed (pain, coughing,diarrhea).     albuterol (2.5 MG/3ML) 0.083% nebulizer solution  Commonly known as:  PROVENTIL  Take 3 mLs (2.5 mg total) by nebulization every 2 (two) hours as needed for wheezing or shortness of breath.     bisoprolol 10 MG tablet  Commonly known as:  ZEBETA  Take 10 mg by mouth daily.     Fluticasone-Salmeterol 250-50 MCG/DOSE Aepb  Commonly known as:  ADVAIR  Inhale 1 puff into the lungs 2 (two) times daily.     furosemide 40 MG tablet  Commonly known as:  LASIX  Take 1.5 tablets (60 mg total) by mouth 2 (two) times daily.     ipratropium-albuterol 0.5-2.5 (3) MG/3ML Soln  Commonly known as:  DUONEB  Take 3 mLs by nebulization 3 (three)  times daily.     levothyroxine 50 MCG tablet  Commonly known as:  SYNTHROID, LEVOTHROID  Take 50 mcg by mouth daily before breakfast.     mirtazapine 30 MG tablet  Commonly known as:  REMERON  Take 30 mg by mouth at bedtime.     pentoxifylline 400 MG CR tablet  Commonly known as:  TRENTAL  Take 400 mg by mouth daily.     potassium chloride 10 MEQ tablet  Commonly known as:  K-DUR  Take 10 mEq by mouth daily.     predniSONE 10 MG tablet  Commonly known as:  DELTASONE  - Take 4 tablets (40 mg) daily for 2 days, then,  - Take 3 tablets (30 mg) daily for 2 days, then,  - Take 2 tablets (20 mg) daily for 2 days, then,  - Take 1 tablets (10 mg) daily for 1 days, then stop     Rivaroxaban 15 MG Tabs tablet  Commonly known as:  XARELTO  Take 1 tablet (15 mg total) by mouth 2 (two) times daily with a meal.     rivaroxaban 20 MG Tabs tablet  Commonly known as:  XARELTO  Take 1 tablet (20 mg total) by mouth daily with supper.  Start taking on:  04/23/2014     simvastatin 20 MG tablet  Commonly known as:  ZOCOR  Take 20 mg  by mouth daily.     VITAMIN D (CHOLECALCIFEROL) PO  Take 1 tablet by mouth every 14 (fourteen) days.        No orders of the defined types were placed in this encounter.    Immunization History  Administered Date(s) Administered  . Influenza-Unspecified 07/17/2013    History  Substance Use Topics  . Smoking status: Former Smoker    Quit date: 09/09/2009  . Smokeless tobacco: Not on file  . Alcohol Use: No    Family history is noncontributory    Review of Systems  DATA OBTAINED: from patient,  GENERAL:no fevers,+ fatigue, appetite changes SKIN: No itching, rash or wounds EYES: No eye pain, redness, discharge EARS: No earache, tinnitus, change in hearing NOSE: No congestion, drainage or bleeding  MOUTH/THROAT: No mouth or tooth pain RESPIRATORY: No cough, wheezing, SOB CARDIAC: No chest pain, palpitations, lower extremity edema  GI: No  abdominal pain, No N/V/D or constipation, No heartburn or reflux  GU: No dysuria, frequency or urgency, or incontinence  MUSCULOSKELETAL: No unrelieved bone/joint pain NEUROLOGIC: No headache, dizziness or focal weakness PSYCHIATRIC: . No behavior issue.   Filed Vitals:   04/09/14 2330  BP: 122/65  Pulse: 85  Temp: 96.5 F (35.8 C)  Resp: 15    Physical Exam  GENERAL APPEARANCE: Alert, conversant. Appropriately groomed. No acute distress.  SKIN: No diaphoresis; Large bruising BUE, confluent and circumferential in some areas, with swelling; discrete rash LE, no swelling, does not look infectious,drug? HEAD: Normocephalic, atraumatic  EYES: Conjunctiva/lids clear. Pupils round, reactive. EOMs intact.  EARS: External exam WNL, canals clear. Hearing grossly normal.  NOSE: No deformity or discharge.  MOUTH/THROAT: Lips w/o lesions.  RESPIRATORY: Breathing is even, unlabored. Lung sounds are clear   CARDIOVASCULAR: Heart RRR no murmurs, rubs or gallops. BUE peripheral edema as noted above  GASTROINTESTINAL: Abdomen is soft, non-tender, not distended w/ normal bowel sounds GENITOURINARY: Bladder non tender, not distended  MUSCULOSKELETAL: No abnormal joints or musculature NEUROLOGIC: Oriented X3. Cranial nerves 2-12 grossly intact. Moves all extremities no tremor. PSYCHIATRIC: Mood and affect appropriate to situation, no behavioral issues  Patient Active Problem List   Diagnosis Date Noted  . SIRS (systemic inflammatory response syndrome) 04/10/2014  . HCAP (healthcare-associated pneumonia) 04/10/2014  . Acute diastolic heart failure 04/10/2014  . DVT of upper extremity (deep vein thrombosis) 04/10/2014  . Diarrhea 04/10/2014  . Unspecified hypothyroidism 04/10/2014  . Encephalopathy acute 03/26/2014  . Altered mental status 03/24/2014  . Transaminitis 03/24/2014  . Acute renal failure 03/24/2014  . Acute respiratory failure 03/24/2014  . SKIN RASH 10/20/2008  . PNEUMONIA  ORGANISM NOS 10/06/2008  . DEPRESSION 10/05/2008  . HYPERTENSION 10/05/2008  . DEEP VEIN THROMBOSIS/PHLEBITIS 10/05/2008  . C O P D 10/05/2008    CBC    Component Value Date/Time   WBC 15.0* 04/01/2014 0503   RBC 4.83 04/01/2014 0503   HGB 14.7 04/01/2014 0503   HCT 45.4 04/01/2014 0503   PLT 140* 04/01/2014 0503   MCV 94.0 04/01/2014 0503   LYMPHSABS 1.1 04/01/2014 0503   MONOABS 0.5 04/01/2014 0503   EOSABS 0.0 04/01/2014 0503   BASOSABS 0.0 04/01/2014 0503    CMP     Component Value Date/Time   NA 144 04/01/2014 0503   K 3.4* 04/01/2014 0503   CL 100 04/01/2014 0503   CO2 33* 04/01/2014 0503   GLUCOSE 154* 04/01/2014 0503   BUN 37* 04/01/2014 0503   CREATININE 1.05 04/01/2014 0503   CALCIUM 7.7*  04/01/2014 0503   PROT 5.3* 04/01/2014 0503   ALBUMIN 2.6* 04/01/2014 0503   AST 35 04/01/2014 0503   ALT 169* 04/01/2014 0503   ALKPHOS 103 04/01/2014 0503   BILITOT 0.7 04/01/2014 0503   GFRNONAA 54* 04/01/2014 0503   GFRAA 63* 04/01/2014 0503    Assessment and Plan  Encephalopathy acute Secondary to metabolic encephalopathy-resolved  -CT head negative   Acute respiratory failure secondary to COPD,PNA , and acute diastolic CHF  -no longer hypoxic-now on room air  -lower ext doppler-negative for DVT-but positive for SVT in the Left leg. On ASA.Spoke with Dr Dickson-[facility] on call over the phone-since SVT in the distal thigh and not near the sapheno-femoral junction no need for anticoagulation, he recommends just ASA.However patient has Left upper ext DVT-will be started on Xarelt significantly better, with IV Steroids, nebs and IV antibitoics. By day of discharge, not hypoxic on room air this am, lungs clear on exam  -Will stopl-and transition to oral prednisone, c/w scheduled nebs on discharge   SIRS (systemic inflammatory response syndrome) -secondary to PNA  -resolved with supportive Care   HCAP (healthcare-associated pneumonia) -afebrile, leukocytosis slowly decreasing. All cultures  negative so far  -Has completed 7 days of IV Antibiotics, completed 5 days of Levaquin, given rash-levaquin-stopped on 03/30/14   Acute diastolic heart failure -much more compensated, CXR done on 7/17-improved.Transition to oral Lasix.  -will need close monitoring of lytes as outpatient    DVT of upper extremity (deep vein thrombosis) -patient will be started on Xarelto on 7/17, please note, developed significantly worsened left upper arm swelling on 7/17-underwent Doppler Upper ext-positive for DVT noted in the axillary vein with superficial thrombus noted in the basilic and antecubital communicating veins. Also has Sup vein thrombosis in right cephalic vein  -Options for anticoagulation discussed, NOAC's vs Coumadin discussed. Bleeding risks discussed. Note daughter at bedside during this discussion. Patient at this time, prefers to be on Xarelto. Given persistent hypoxia, could have Pul Embolism, patient has a very small IV, offered to order a CT Angio chest, but at this time patient prefers not to get "stuck" again to get another IV access. Suspect we can just treat for 3-6 months, given clinical stability, suspect getting a CT Angio will not change the current management.  -Patient will require hematology evaluation prior to discontinuation of anticoagulation   Acute renal failure -resolved  -suspect this was pre-renal  -seen by renal during her stay here   Diarrhea -resolved  -C Diff PCR neg    HYPERTENSION Continue zebeta and lasix  Unspecified hypothyroidism Continue synthroid 50mcg    Margit HanksALEXANDER, Ethie Curless D, MD

## 2014-04-06 NOTE — Clinical Social Work Placement (Signed)
Clinical Social Work Department CLINICAL SOCIAL WORK PLACEMENT NOTE 04/06/2014  Patient:  [name],[name]  Account Number:  [id number] Admit date:  03/24/2014  Clinical Social Worker:  Cherre BlancJOSEPH BRYANT Tanielle Em[place], [place]  Date/time:  03/31/2014 02:30 PM  Clinical Social Work is seeking post-discharge placement for this patient at the following level of care:   SKILLED NURSING   (*CSW will update this form in Epic as items are completed)   03/31/2014  [facility]/family provided with [facility] Clinical [facility]'s list of facilities offering this level of care within the geographic area requested by the patient (or if unable, by the patient's family).  03/31/2014  Patient/family informed of their freedom to choose among providers that offer the needed level of care, that participate in Medicare, Medicaid or managed care program needed by the patient, have an available bed and are willing to accept the patient.  03/31/2014  Patient/family informed of [facility]' ownership interest in [facility], as well as of the fact that they are under no obligation to receive care at this facility.  PASARR submitted to EDS on 03/31/2014 PASARR number received on 03/31/2014  FL2 transmitted to all facilities in geographic area requested by pt/family on  03/31/2014 FL2 transmitted to all facilities within larger geographic area on   Patient informed that his/her managed care company has contracts with or will negotiate with  certain facilities, including the following:     Patient/family informed of bed offers received:  03/31/2014 [facility] Physician recommends and patient chooses bed at    [facility]  04/02/2014 Patient to be transferred to facility by Ambulance Patient and family notified of transfer on  Name of family member notified:    The following physician request were  entered in Epic:   Additional Comments: Patient DC'ed to facility by weekend CSW.    [name] [name] MSW, [place], [place], [phone]

## 2014-04-10 ENCOUNTER — Encounter: Payer: Self-pay | Admitting: Internal Medicine

## 2014-04-10 DIAGNOSIS — I5031 Acute diastolic (congestive) heart failure: Secondary | ICD-10-CM | POA: Insufficient documentation

## 2014-04-10 DIAGNOSIS — J189 Pneumonia, unspecified organism: Secondary | ICD-10-CM | POA: Insufficient documentation

## 2014-04-10 DIAGNOSIS — E039 Hypothyroidism, unspecified: Secondary | ICD-10-CM | POA: Insufficient documentation

## 2014-04-10 DIAGNOSIS — R197 Diarrhea, unspecified: Secondary | ICD-10-CM | POA: Insufficient documentation

## 2014-04-10 DIAGNOSIS — R6510 Systemic inflammatory response syndrome (SIRS) of non-infectious origin without acute organ dysfunction: Secondary | ICD-10-CM | POA: Insufficient documentation

## 2014-04-10 DIAGNOSIS — I82629 Acute embolism and thrombosis of deep veins of unspecified upper extremity: Secondary | ICD-10-CM | POA: Insufficient documentation

## 2014-04-10 NOTE — Assessment & Plan Note (Signed)
Continue synthroid 50 mcg 

## 2014-04-10 NOTE — Assessment & Plan Note (Signed)
-  afebrile, leukocytosis slowly decreasing. All cultures negative so far  -Has completed 7 days of IV Antibiotics, completed 5 days of Levaquin, given rash-levaquin-stopped on 03/30/14

## 2014-04-10 NOTE — Assessment & Plan Note (Signed)
-  patient will be started on Xarelto on 7/17, please note, developed significantly worsened left upper arm swelling on 7/17-underwent Doppler Upper ext-positive for DVT noted in the axillary vein with superficial thrombus noted in the basilic and antecubital communicating veins. Also has Sup vein thrombosis in right cephalic vein  -Options for anticoagulation discussed, NOAC's vs Coumadin discussed. Bleeding risks discussed. Note daughter at bedside during this discussion. Patient at this time, prefers to be on Xarelto. Given persistent hypoxia, could have Pul Embolism, patient has a very small IV, offered to order a CT Angio chest, but at this time patient prefers not to get "stuck" again to get another IV access. Suspect we can just treat for 3-6 months, given clinical stability, suspect getting a CT Angio will not change the current management.  -Patient will require hematology evaluation prior to discontinuation of anticoagulation

## 2014-04-10 NOTE — Assessment & Plan Note (Signed)
-  secondary to PNA  -resolved with supportive Care

## 2014-04-10 NOTE — Assessment & Plan Note (Signed)
-  resolved  -suspect this was pre-renal  -seen by renal during her stay here

## 2014-04-10 NOTE — Assessment & Plan Note (Signed)
Continue zebeta and lasix

## 2014-04-10 NOTE — Assessment & Plan Note (Addendum)
secondary to COPD,PNA , and acute diastolic CHF  -no longer hypoxic-now on room air  -lower ext doppler-negative for DVT-but positive for SVT in the Left leg. On ASA.Spoke with Dr Dickson-[facility] on call over the phone-since SVT in the distal thigh and not near the sapheno-femoral junction no need for anticoagulation, he recommends just ASA.However patient has Left upper ext DVT-will be started on Xarelt significantly better, with IV Steroids, nebs and IV antibitoics. By day of discharge, not hypoxic on room air this am, lungs clear on exam  -Will stopl-and transition to oral prednisone, c/w scheduled nebs on discharge

## 2014-04-10 NOTE — Assessment & Plan Note (Signed)
-  much more compensated, CXR done on 7/17-improved.Transition to oral Lasix.  -will need close monitoring of lytes as outpatient

## 2014-04-10 NOTE — Assessment & Plan Note (Signed)
Secondary to metabolic encephalopathy-resolved  -CT head negative

## 2014-04-10 NOTE — Assessment & Plan Note (Signed)
-  resolved  -C Diff PCR neg

## 2014-04-14 ENCOUNTER — Encounter: Payer: Self-pay | Admitting: Internal Medicine

## 2014-04-14 ENCOUNTER — Non-Acute Institutional Stay (SKILLED_NURSING_FACILITY): Payer: Medicare Other | Admitting: Internal Medicine

## 2014-04-14 DIAGNOSIS — R21 Rash and other nonspecific skin eruption: Secondary | ICD-10-CM

## 2014-04-14 DIAGNOSIS — J9600 Acute respiratory failure, unspecified whether with hypoxia or hypercapnia: Secondary | ICD-10-CM

## 2014-04-14 DIAGNOSIS — G4700 Insomnia, unspecified: Secondary | ICD-10-CM

## 2014-04-14 DIAGNOSIS — J9601 Acute respiratory failure with hypoxia: Secondary | ICD-10-CM

## 2014-04-14 DIAGNOSIS — I5031 Acute diastolic (congestive) heart failure: Secondary | ICD-10-CM

## 2014-04-14 DIAGNOSIS — J189 Pneumonia, unspecified organism: Secondary | ICD-10-CM

## 2014-04-14 DIAGNOSIS — I82629 Acute embolism and thrombosis of deep veins of unspecified upper extremity: Secondary | ICD-10-CM

## 2014-04-14 DIAGNOSIS — E039 Hypothyroidism, unspecified: Secondary | ICD-10-CM

## 2014-04-14 DIAGNOSIS — I82622 Acute embolism and thrombosis of deep veins of left upper extremity: Secondary | ICD-10-CM

## 2014-04-14 NOTE — Assessment & Plan Note (Signed)
Continue synthroid.

## 2014-04-14 NOTE — Assessment & Plan Note (Signed)
No coughing

## 2014-04-14 NOTE — Assessment & Plan Note (Signed)
Pt is anxious to see if she can go without O2;will start now but only if O2 > 90; plan on having pt wear it at night ,off during the day and if pt feels SOB.

## 2014-04-14 NOTE — Assessment & Plan Note (Signed)
Above -trial off O2

## 2014-04-14 NOTE — Assessment & Plan Note (Addendum)
Pt's BUE edema and bruising is very much improved. Still more swelling LUE, site of DVT;plan continue xarelto until pt f/u with hematology

## 2014-04-14 NOTE — Progress Notes (Signed)
MRN: [id number] Name: Shawanda Sievert  Sex: female Age: 68 y.o. DOB: [birth year]  PSC #: [facility] Facility/Room: 108 Level Of Care: SNF Provider: Merrilee Seashore D Emergency Contacts: Extended Emergency Contact Information Primary Emergency Contact: Bullard,[name] Address: [address]          [place], [place] Home Phone: [phone] Work Phone: [phone] Relation: Mother Secondary Emergency Contact: Cox,[name]  [place] Home Phone: [phone] Relation: Sister  Code Status: FULL  Allergies: Codeine and Penicillins  Chief Complaint  Patient presents with  . Hospitalization Follow-up    HPI: Patient is 68 y.o. female who is recovering from SIRS and PNA and LUE blood clot.  Past Medical History  Diagnosis Date  . COPD (chronic obstructive pulmonary disease)   . Hypertension   . CHF (congestive heart failure)     History reviewed. No pertinent past surgical history.    Medication List       This list is accurate as of: 04/14/14  8:11 PM.  Always use your most recent med list.               acetaminophen-codeine 300-30 MG per tablet  Commonly known as:  TYLENOL #3  Take 1-2 tablets by mouth every 4 (four) hours as needed (pain, coughing,diarrhea).     albuterol (2.5 MG/3ML) 0.083% nebulizer solution  Commonly known as:  PROVENTIL  Take 3 mLs (2.5 mg total) by nebulization every 2 (two) hours as needed for wheezing or shortness of breath.     bisoprolol 10 MG tablet  Commonly known as:  ZEBETA  Take 10 mg by mouth daily.     Fluticasone-Salmeterol 250-50 MCG/DOSE Aepb  Commonly known as:  ADVAIR  Inhale 1 puff into the lungs 2 (two) times daily.     furosemide 40 MG tablet  Commonly known as:  LASIX  Take 1.5 tablets (60 mg total) by mouth 2 (two) times daily.     ipratropium-albuterol 0.5-2.5 (3) MG/3ML Soln  Commonly known as:  DUONEB  Take 3 mLs by nebulization 3 (three) times daily.     levothyroxine 50 MCG tablet  Commonly known as:  SYNTHROID, LEVOTHROID  Take 50 mcg by mouth daily before breakfast.     mirtazapine 30 MG tablet  Commonly known as:  REMERON  Take 30 mg by mouth at bedtime.     pentoxifylline 400 MG CR tablet  Commonly known as:  TRENTAL  Take 400 mg by mouth daily.     potassium chloride 10 MEQ tablet  Commonly known as:  K-DUR  Take 10 mEq by mouth daily.     predniSONE 10 MG tablet  Commonly known as:  DELTASONE  - Take 4 tablets (40 mg) daily for 2 days, then,  - Take 3 tablets (30 mg) daily for 2 days, then,  - Take 2 tablets (20 mg) daily for 2 days, then,  - Take 1 tablets (10 mg) daily for 1 days, then stop     Rivaroxaban 15 MG Tabs tablet  Commonly known as:  XARELTO  Take 1 tablet (15 mg total) by mouth 2 (two) times daily with a meal.     rivaroxaban 20 MG Tabs tablet  Commonly known as:  XARELTO  Take 1 tablet (20 mg total) by mouth daily with supper.  Start taking on:  04/23/2014     simvastatin 20 MG tablet  Commonly known as:  ZOCOR  Take 20 mg by mouth daily.  VITAMIN D (CHOLECALCIFEROL) PO  Take 1 tablet by mouth every 14 (fourteen) days.        No orders of the defined types were placed in this encounter.    Immunization History  Administered Date(s) Administered  . Influenza-Unspecified 07/17/2013    History  Substance Use Topics  . Smoking status: Former Smoker    Quit date: 09/09/2009  . Smokeless tobacco: Not on file  . Alcohol Use: No    Review of Systems  DATA OBTAINED: from patient, nurse, medical record, family member GENERAL: Feels well no fevers, fatigue, appetite changes;wants to go home SKIN: no rash HEENT: No complaint RESPIRATORY: No cough, wheezing, SOB; wants a trial off O2 CARDIAC: No chest pain, palpitations, lower extremity edema  GI: No abdominal pain, No N/V/D or constipation, No heartburn or reflux  GU: No dysuria, frequency or urgency, or incontinence  MUSCULOSKELETAL:  No unrelieved bone/joint pain NEUROLOGIC: No headache, dizziness or focal weakness PSYCHIATRIC: No overt anxiety or sadness. Not sleeping well but hasn't told anyone   Filed Vitals:   04/14/14 1959  BP: 140/88  Pulse: 66  Temp: 97.8 F (36.6 C)  Resp: 20    Physical Exam  GENERAL APPEARANCE: Alert, conversant. Appropriately groomed. No acute distress  SKIN: large bruising and swelling very improved HEENT: Unremarkable RESPIRATORY: Breathing is even, unlabored. Lung sounds are clear   CARDIOVASCULAR: Heart RRR no murmurs, rubs or gallops. No peripheral edema  GASTROINTESTINAL: Abdomen is soft, non-tender, not distended w/ normal bowel sounds.  GENITOURINARY: Bladder non tender, not distended  MUSCULOSKELETAL: No abnormal joints or musculature NEUROLOGIC: Cranial nerves 2-12 grossly intact. Moves all extremities no tremor. PSYCHIATRIC: Mood and affect appropriate to situation, no behavioral issues  Patient Active Problem List   Diagnosis Date Noted  . SIRS (systemic inflammatory response syndrome) 04/10/2014  . HCAP (healthcare-associated pneumonia) 04/10/2014  . Acute diastolic heart failure 04/10/2014  . DVT of upper extremity (deep vein thrombosis) 04/10/2014  . Diarrhea 04/10/2014  . Unspecified hypothyroidism 04/10/2014  . Encephalopathy acute 03/26/2014  . Altered mental status 03/24/2014  . Transaminitis 03/24/2014  . Acute renal failure 03/24/2014  . Acute respiratory failure 03/24/2014  . SKIN RASH 10/20/2008  . PNEUMONIA ORGANISM NOS 10/06/2008  . DEPRESSION 10/05/2008  . HYPERTENSION 10/05/2008  . DEEP VEIN THROMBOSIS/PHLEBITIS 10/05/2008  . C O P D 10/05/2008    CBC    Component Value Date/Time   WBC 15.0* 04/01/2014 0503   RBC 4.83 04/01/2014 0503   HGB 14.7 04/01/2014 0503   HCT 45.4 04/01/2014 0503   PLT 140* 04/01/2014 0503   MCV 94.0 04/01/2014 0503   LYMPHSABS 1.1 04/01/2014 0503   MONOABS 0.5 04/01/2014 0503   EOSABS 0.0 04/01/2014 0503   BASOSABS  0.0 04/01/2014 0503    CMP     Component Value Date/Time   NA 144 04/01/2014 0503   K 3.4* 04/01/2014 0503   CL 100 04/01/2014 0503   CO2 33* 04/01/2014 0503   GLUCOSE 154* 04/01/2014 0503   BUN 37* 04/01/2014 0503   CREATININE 1.05 04/01/2014 0503   CALCIUM 7.7* 04/01/2014 0503   PROT 5.3* 04/01/2014 0503   ALBUMIN 2.6* 04/01/2014 0503   AST 35 04/01/2014 0503   ALT 169* 04/01/2014 0503   ALKPHOS 103 04/01/2014 0503   BILITOT 0.7 04/01/2014 0503   GFRNONAA 54* 04/01/2014 0503   GFRAA 63* 04/01/2014 0503    Assessment and Plan  DVT of upper extremity (deep vein thrombosis) Pt's BUE edema and bruising  is very much improved. Still more swelling LUE, site of DVT;plan continue xarelto until pt f/u with hematology  Acute diastolic heart failure Pt is anxious to see if she can go without O2;will start now but only if O2 > 90; plan on having pt wear it at night ,off during the day and if pt feels SOB.  Acute respiratory failure Above -trial off O2  HCAP (healthcare-associated pneumonia) No coughing  Unspecified hypothyroidism Continue synthroid  SKIN RASH LU rash resolved  INSOMNIA - start melatonin 3 mg q HS  Margit Hanks, MD

## 2014-04-14 NOTE — Assessment & Plan Note (Signed)
LU rash resolved

## 2014-04-21 ENCOUNTER — Non-Acute Institutional Stay (SKILLED_NURSING_FACILITY): Payer: Medicare Other | Admitting: Internal Medicine

## 2014-04-21 ENCOUNTER — Encounter: Payer: Self-pay | Admitting: Internal Medicine

## 2014-04-21 DIAGNOSIS — I82629 Acute embolism and thrombosis of deep veins of unspecified upper extremity: Secondary | ICD-10-CM

## 2014-04-21 DIAGNOSIS — I5031 Acute diastolic (congestive) heart failure: Secondary | ICD-10-CM

## 2014-04-21 DIAGNOSIS — I10 Essential (primary) hypertension: Secondary | ICD-10-CM

## 2014-04-21 DIAGNOSIS — I82622 Acute embolism and thrombosis of deep veins of left upper extremity: Secondary | ICD-10-CM

## 2014-04-21 DIAGNOSIS — R740 Nonspecific elevation of levels of transaminase and lactic acid dehydrogenase [LDH]: Secondary | ICD-10-CM

## 2014-04-21 DIAGNOSIS — J189 Pneumonia, unspecified organism: Secondary | ICD-10-CM

## 2014-04-21 DIAGNOSIS — R6510 Systemic inflammatory response syndrome (SIRS) of non-infectious origin without acute organ dysfunction: Secondary | ICD-10-CM

## 2014-04-21 DIAGNOSIS — J449 Chronic obstructive pulmonary disease, unspecified: Secondary | ICD-10-CM

## 2014-04-21 DIAGNOSIS — E039 Hypothyroidism, unspecified: Secondary | ICD-10-CM

## 2014-04-21 DIAGNOSIS — R7402 Elevation of levels of lactic acid dehydrogenase (LDH): Secondary | ICD-10-CM

## 2014-04-21 DIAGNOSIS — J4489 Other specified chronic obstructive pulmonary disease: Secondary | ICD-10-CM

## 2014-04-21 DIAGNOSIS — R7401 Elevation of levels of liver transaminase levels: Secondary | ICD-10-CM

## 2014-04-21 NOTE — Progress Notes (Signed)
MRN: [id number] Name: [name]  Sex: female Age: 68 y.o. DOB: [birth year]  PSC #: [facility] Facility/Room: 108 Level Of Care: SNF Provider: Merrilee Seashore D Emergency Contacts: Extended Emergency Contact Information Primary Emergency Contact: Bullard,[name] Address: [address]          [place], [place] Home Phone: [phone] Work Phone: [phone] Relation: Mother Secondary Emergency Contact: Cox,[name]  [place] Home Phone: [phone] Relation: Sister  Code Status: DNR  Allergies: Codeine and Penicillins  Chief Complaint  Patient presents with  . Discharge Note    HPI: Patient is 68 y.o. female who was admitted for OT/PT s/p SIRS and PNA who is now ready to be discharged to home.  Past Medical History  Diagnosis Date  . COPD (chronic obstructive pulmonary disease)   . Hypertension   . CHF (congestive heart failure)     History reviewed. No pertinent past surgical history.    Medication List       This list is accurate as of: 04/21/14  4:37 PM.  Always use your most recent med list.               albuterol (2.5 MG/3ML) 0.083% nebulizer solution  Commonly known as:  PROVENTIL  Take 3 mLs (2.5 mg total) by nebulization every 2 (two) hours as needed for wheezing or shortness of breath.     bisoprolol 10 MG tablet  Commonly known as:  ZEBETA  Take 10 mg by mouth daily.     Fluticasone-Salmeterol 250-50 MCG/DOSE Aepb  Commonly known as:  ADVAIR  Inhale 1 puff into the lungs 2 (two) times daily.     furosemide 40 MG tablet  Commonly known as:  LASIX  Take 1.5 tablets (60 mg total) by mouth 2 (two) times daily.     ipratropium-albuterol 0.5-2.5 (3) MG/3ML Soln  Commonly known as:  DUONEB  Take 3 mLs by nebulization 3 (three) times daily.     levothyroxine 50 MCG tablet  Commonly known as:  SYNTHROID, LEVOTHROID  Take 50 mcg by mouth daily before breakfast.     Melatonin 3 MG Tabs  Take 1  tablet by mouth at bedtime.     mirtazapine 30 MG tablet  Commonly known as:  REMERON  Take 30 mg by mouth at bedtime.     pentoxifylline 400 MG CR tablet  Commonly known as:  TRENTAL  Take 400 mg by mouth daily.     potassium chloride 10 MEQ tablet  Commonly known as:  K-DUR  Take 10 mEq by mouth daily.     Rivaroxaban 15 MG Tabs tablet  Commonly known as:  XARELTO  Take 1 tablet (15 mg total) by mouth 2 (two) times daily with a meal.     rivaroxaban 20 MG Tabs tablet  Commonly known as:  XARELTO  Take 1 tablet (20 mg total) by mouth daily with supper.  Start taking on:  04/23/2014     simvastatin 20 MG tablet  Commonly known as:  ZOCOR  Take 20 mg by mouth daily.     VITAMIN D (CHOLECALCIFEROL) PO  Take 1,000 mg by mouth every 14 (fourteen) days.        Meds ordered this encounter  Medications  . Melatonin 3 MG TABS    Sig: Take 1 tablet by mouth at bedtime.    Immunization History  Administered Date(s) Administered  . Influenza-Unspecified 07/17/2013    History  Substance Use Topics  . Smoking  status: Former Smoker    Quit date: 09/09/2009  . Smokeless tobacco: Not on file  . Alcohol Use: No    Filed Vitals:   04/21/14 1635  BP: 126/74  Pulse: 89  Temp: 96.9 F (36.1 C)  Resp: 18    Physical Exam  GENERAL APPEARANCE: Alert, conversant. Appropriately groomed. No acute distress.  HEENT: Unremarkable. RESPIRATORY: Breathing is even, unlabored. Lung sounds are clear   CARDIOVASCULAR: Heart RRR no murmurs, rubs or gallops. No peripheral edema.  GASTROINTESTINAL: Abdomen is soft, non-tender, not distended w/ normal bowel sounds.  NEUROLOGIC: Cranial nerves 2-12 grossly intact. Moves all extremities no tremor.  Patient Active Problem List   Diagnosis Date Noted  . SIRS (systemic inflammatory response syndrome) 04/10/2014  . HCAP (healthcare-associated pneumonia) 04/10/2014  . Acute diastolic heart failure 04/10/2014  . DVT of upper extremity (deep  vein thrombosis) 04/10/2014  . Diarrhea 04/10/2014  . Unspecified hypothyroidism 04/10/2014  . Encephalopathy acute 03/26/2014  . Altered mental status 03/24/2014  . Transaminitis 03/24/2014  . Acute renal failure 03/24/2014  . Acute respiratory failure 03/24/2014  . SKIN RASH 10/20/2008  . PNEUMONIA ORGANISM NOS 10/06/2008  . DEPRESSION 10/05/2008  . HYPERTENSION 10/05/2008  . DEEP VEIN THROMBOSIS/PHLEBITIS 10/05/2008  . C O P D 10/05/2008    CBC    Component Value Date/Time   WBC 15.0* 04/01/2014 0503   RBC 4.83 04/01/2014 0503   HGB 14.7 04/01/2014 0503   HCT 45.4 04/01/2014 0503   PLT 140* 04/01/2014 0503   MCV 94.0 04/01/2014 0503   LYMPHSABS 1.1 04/01/2014 0503   MONOABS 0.5 04/01/2014 0503   EOSABS 0.0 04/01/2014 0503   BASOSABS 0.0 04/01/2014 0503    CMP     Component Value Date/Time   NA 144 04/01/2014 0503   K 3.4* 04/01/2014 0503   CL 100 04/01/2014 0503   CO2 33* 04/01/2014 0503   GLUCOSE 154* 04/01/2014 0503   BUN 37* 04/01/2014 0503   CREATININE 1.05 04/01/2014 0503   CALCIUM 7.7* 04/01/2014 0503   PROT 5.3* 04/01/2014 0503   ALBUMIN 2.6* 04/01/2014 0503   AST 35 04/01/2014 0503   ALT 169* 04/01/2014 0503   ALKPHOS 103 04/01/2014 0503   BILITOT 0.7 04/01/2014 0503   GFRNONAA 54* 04/01/2014 0503   GFRAA 63* 04/01/2014 0503    Assessment and Plan  Pt is discharged to home in stable and improved condition but still very weak. She will need home O2 and a safety eval of her home, along with HH/OT/PT.Marland Kitchen.  Margit HanksALEXANDER, Judianne Seiple D, MD

## 2014-05-06 ENCOUNTER — Ambulatory Visit (INDEPENDENT_AMBULATORY_CARE_PROVIDER_SITE_OTHER)
Admission: RE | Admit: 2014-05-06 | Discharge: 2014-05-06 | Disposition: A | Discharge status: Home / Self Care | Payer: Medicare Other | Source: Ambulatory Visit | Attending: Internal Medicine | Admitting: Internal Medicine

## 2014-05-06 ENCOUNTER — Encounter: Payer: Self-pay | Admitting: Internal Medicine

## 2014-05-06 ENCOUNTER — Ambulatory Visit (INDEPENDENT_AMBULATORY_CARE_PROVIDER_SITE_OTHER): Payer: Medicare Other | Admitting: Internal Medicine

## 2014-05-06 ENCOUNTER — Other Ambulatory Visit (INDEPENDENT_AMBULATORY_CARE_PROVIDER_SITE_OTHER): Payer: Medicare Other

## 2014-05-06 VITALS — BP 108/64 | HR 112

## 2014-05-06 DIAGNOSIS — J9611 Chronic respiratory failure with hypoxia: Secondary | ICD-10-CM

## 2014-05-06 DIAGNOSIS — R0600 Dyspnea, unspecified: Secondary | ICD-10-CM

## 2014-05-06 DIAGNOSIS — J4489 Other specified chronic obstructive pulmonary disease: Secondary | ICD-10-CM

## 2014-05-06 DIAGNOSIS — R0989 Other specified symptoms and signs involving the circulatory and respiratory systems: Secondary | ICD-10-CM

## 2014-05-06 DIAGNOSIS — J449 Chronic obstructive pulmonary disease, unspecified: Secondary | ICD-10-CM

## 2014-05-06 DIAGNOSIS — R0902 Hypoxemia: Secondary | ICD-10-CM

## 2014-05-06 DIAGNOSIS — R0609 Other forms of dyspnea: Secondary | ICD-10-CM

## 2014-05-06 DIAGNOSIS — J9610 Chronic respiratory failure, unspecified whether with hypoxia or hypercapnia: Secondary | ICD-10-CM

## 2014-05-06 DIAGNOSIS — J9612 Chronic respiratory failure with hypercapnia: Secondary | ICD-10-CM | POA: Insufficient documentation

## 2014-05-06 MED ORDER — UMECLIDINIUM-VILANTEROL 62.5-25 MCG/INH IN AEPB: INHALATION_SPRAY | RESPIRATORY_TRACT | Status: DC

## 2014-05-06 NOTE — Patient Instructions (Addendum)
Change advair to anoro one click daily   02 recs Rest  2lpm  Walking 3lpm  Sleeping = 2lpm   Please remember to go to the lab and x-ray department downstairs for your tests - we will call you with the results when they are available.    Please schedule a follow up office visit in 4-6 weeks, call sooner if needed with pfts on return   late add needs alpha one level to complete the w/u - also prevnar if not given yet

## 2014-05-06 NOTE — Progress Notes (Addendum)
Subjective:    Patient ID: [name], female    DOB: [birth year]  MRN: [id number]  HPI  17 yowm quit smoking 2010 with admitted with pna did improve to the point June 2015 could walk across a parking lot into big stores/ ok on housework/no yardwork s 02 or pulmonary rx  then admitted [facility] July 2015 abruptly ill with dx cap > rehab and back at home since first week aug in 2015 and referred 05/06/2014 to pulmonary clinic by Dr Jeannetta Nap for persistent hypoxemia.   Admit Date: 03/24/2014  Discharge Date:04/02/14  Recommendations for Outpatient Follow-up:  1. Please arrange follow up Dermatology if rash continues to persist 2. Please repeat BMET in 5 days 3. Please repeat CBC in 1 week 4. Upper Ext DVT positive-started on Xarelto-please have patient see hematology prior to discontinuing anticoagulation. PRIMARY DISCHARGE DIAGNOSIS:   PNEUMONIA ORGANISM NOS  C O P D  SKIN RASH  Altered mental status  Transaminitis  Acute renal failure  Acute respiratory failure  Encephalopathy acute  Left Upper Ext DVT  COPD exacerbation  SIR'S    BRIEF HOSPITAL COURSE:  Active Problems:  Altered mental status  -Secondary to metabolic encephalopathy-resolved  -CT head negative  COPD exacerbation  -significantly better, with IV Steroids, nebs and IV antibitoics. By day of discharge, not hypoxic on room air this am, lungs clear on exam  - transition to oral prednisone, c/w scheduled nebs on discharge  SIRS  -secondary to PNA  -resolved with supportive Care  CAP  -afebrile, leukocytosis slowly decreasing. All cultures negative    -Has completed 7 days of IV Antibiotics, completed 5 days of Levaquin, given rash-levaquin-stopped on 03/30/14  Acute Hypoxic resp failure  -secondary to COPD,PNA , and acute diastolic CHF  -no longer hypoxic-now on room air  -lower ext doppler-negative for DVT-but positive for SVT in the Left leg. On ASA.Spoke with Dr Dickson-[facility] on call over the phone-since SVT in the  distal thigh and not near the sapheno-femoral junction no need for anticoagulation, he recommends just ASA.However patient has Left upper ext DVT- rx Xarelto  Acute Diastolic CHF  -much more compensated, CXR done on 7/17-improved.Transitioned to oral Lasix.  -will need close monitoring of lytes as outpatient  Left Upper Ext DVT  -rx Xarelto on 7/17, please note, developed significantly worsened left upper arm swelling on 7/17-underwent Doppler Upper ext-positive for DVT noted in the axillary vein with superficial thrombus noted in the basilic and antecubital communicating veins. Also has Sup vein thrombosis in right cephalic vein  -Options for anticoagulation discussed, NOAC's vs Coumadin discussed. Bleeding risks discussed. Note daughter at bedside during this discussion. Patient at this time, prefers to be on Xarelto. Given persistent hypoxia, could have Pul Embolism, patient has a very small IV, offered to order a CT Angio chest, but at this time patient prefers not to get "stuck" again to get another IV access. Suspect we can just treat for 3-6 months, given clinical stability, suspect getting a CT Angio will not change the current management.  -Patient will require hematology evaluation prior to discontinuation of anticoagulation  Leukocytosis  -likely leukemoid reaction- see above regarding Abx. Also on steroids.  -WBC decreasing, tapering off steroids  Hyperkalemia  -on 7/16-?etiology  - treated with IV Insulin/D50/Kayexalate-resolved  ARF  -resolved  -suspect this was pre-renal  -seen by renal during her stay here  Transaminitis  -secondary to acute illness, acute hepatitis serology neg  -downtrending, Abd Ultrasound neg for acute abnormalities, but does  have cholelithiasis-suspect this is chronic and not related to transaminitis  Diarrhea  -resolved  -C Diff PCR neg  Thrombocytopenia  -secondary to acute illness  -improving-platelets up to 140 K today  Hypothyroidism  -c/w  Levothyroxine  HTN  -c/w Bisoprolol  -BP controlled    05/06/2014 1st  Pulmonary office visit/ [name] / consult per Dr Jeannetta Nap re persistent hypoxemia Chief Complaint  Patient presents with  . Pulmonary Consult    Referred by Dr. Jeannetta Nap for hypoxia and abnormal CXR. Pt was on [facility] in 03/2014. Pt c/o DOE. Pt denies cough and CP/tightness.   advair bid and does not use neb Has 02 uses prn only Sleeps at 30 degree which is same before hosp s noct resp disturbance  No obvious other patterns in day to day or daytime variabilty or assoc chronic cough or cp or chest tightness, subjective wheeze overt sinus or hb symptoms. No unusual exp hx or h/o childhood pna/ asthma or knowledge of premature birth.  Sleeping ok without nocturnal  or early am exacerbation  of respiratory  c/o's or need for noct saba. Also denies any obvious fluctuation of symptoms with weather or environmental changes or other aggravating or alleviating factors except as outlined above   Current Medications, Allergies, Complete Past Medical History, Past Surgical History, Family History, and Social History were reviewed in [employer] record.              Review of Systems  Constitutional: Negative for fever and unexpected weight change.  HENT: Positive for rhinorrhea. Negative for congestion, dental problem, ear pain, nosebleeds, postnasal drip, sinus pressure, sneezing, sore throat and trouble swallowing.   Eyes: Negative for redness and itching.  Respiratory: Positive for shortness of breath. Negative for cough, chest tightness and wheezing.   Cardiovascular: Negative for palpitations and leg swelling.  Gastrointestinal: Negative for nausea and vomiting.  Genitourinary: Negative for dysuria.  Musculoskeletal: Negative for joint swelling.  Skin: Negative for rash.  Neurological: Negative for headaches.  Hematological: Does not bruise/bleed easily.  Psychiatric/Behavioral: Negative for  dysphoric mood. The patient is not nervous/anxious.        Objective:   Physical Exam   amb wf / mentation slowed   Wt Readings from Last 3 Encounters:  04/02/14 166 lb 12.8 oz (75.66 kg)  04/03/09 123 lb (55.792 kg)  11/29/08 110 lb (49.896 kg)      HEENT mild turbinate edema.  Oropharynx no thrush or excess pnd or cobblestoning.  No JVD or cervical adenopathy. Mild accessory muscle hypertrophy. Trachea midline, nl thryroid. Chest was hyperinflated by percussion with diminished breath sounds and moderate increased exp time with junky exp rhonchi L > R  Hoover sign positive at mid inspiration. Regular rate and rhythm without murmur gallop or rub or increase P2 or edema.  Abd: no hsm, nl excursion. Ext warm without cyanosis or clubbing.        CXR  05/06/2014 : Underlying emphysematous change with extensive bronchiectasis and interstitial fibrotic type change, stable. No frank edema or consolidation. Previous effusions have resolved. No demonstrable adenopathy.   Recent Labs Lab 05/06/14 1539  NA 139  K 4.1  CL 93*  CO2 34*  BUN 13  CREATININE 1.3*  GLUCOSE 84    Recent Labs Lab 05/06/14 1539  HGB 13.3  HCT 41.7  WBC 13.4*  PLT 306.0      Lab Results  Component Value Date   TSH 2.87 05/06/2014     Lab Results  Component Value Date   PROBNP 191.0* 05/06/2014        Assessment & Plan:

## 2014-05-07 LAB — CBC WITH DIFFERENTIAL/PLATELET
Basophils Absolute: 0.1 10*3/uL (ref 0.0–0.1)
Basophils Relative: 0.9 % (ref 0.0–3.0)
EOS PCT: 0.9 % (ref 0.0–5.0)
Eosinophils Absolute: 0.1 10*3/uL (ref 0.0–0.7)
HCT: 41.7 % (ref 36.0–46.0)
Hemoglobin: 13.3 g/dL (ref 12.0–15.0)
Lymphocytes Relative: 17.2 % (ref 12.0–46.0)
Lymphs Abs: 2.3 10*3/uL (ref 0.7–4.0)
MCHC: 31.8 g/dL (ref 30.0–36.0)
MCV: 93.5 fl (ref 78.0–100.0)
Monocytes Absolute: 0.9 10*3/uL (ref 0.1–1.0)
Monocytes Relative: 6.9 % (ref 3.0–12.0)
NEUTROS PCT: 74.1 % (ref 43.0–77.0)
Neutro Abs: 9.9 10*3/uL — ABNORMAL HIGH (ref 1.4–7.7)
PLATELETS: 306 10*3/uL (ref 150.0–400.0)
RBC: 4.47 Mil/uL (ref 3.87–5.11)
RDW: 15.3 % (ref 11.5–15.5)
WBC: 13.4 10*3/uL — AB (ref 4.0–10.5)

## 2014-05-07 LAB — BRAIN NATRIURETIC PEPTIDE: Pro B Natriuretic peptide (BNP): 191 pg/mL — ABNORMAL HIGH (ref 0.0–100.0)

## 2014-05-07 LAB — BASIC METABOLIC PANEL
BUN: 13 mg/dL (ref 6–23)
CALCIUM: 10 mg/dL (ref 8.4–10.5)
CO2: 34 mEq/L — ABNORMAL HIGH (ref 19–32)
Chloride: 93 mEq/L — ABNORMAL LOW (ref 96–112)
Creatinine, Ser: 1.3 mg/dL — ABNORMAL HIGH (ref 0.4–1.2)
GFR: 43.05 mL/min — AB (ref >60.00)
Glucose, Bld: 84 mg/dL (ref 70–99)
Potassium: 4.1 mEq/L (ref 3.5–5.1)
Sodium: 139 mEq/L (ref 135–145)

## 2014-05-07 LAB — TSH: TSH: 2.87 u[IU]/mL (ref 0.35–4.50)

## 2014-05-07 NOTE — Assessment & Plan Note (Signed)
See copd/chronic resp failure a/p

## 2014-05-07 NOTE — Assessment & Plan Note (Signed)
-   02 dep 24/7 as of 05/06/14  - cor pulmonale by echo, mild,  03/25/14  - trial of anoro 05/06/14   DDX of  difficult airways management all start with A and  include Adherence, Ace Inhibitors, Acid Reflux, Active Sinus Disease, Alpha 1 Antitripsin deficiency, Anxiety masquerading as Airways dz,  ABPA,  allergy(esp in young), Aspiration (esp in elderly), Adverse effects of DPI,  Active smokers, plus two Bs  = Bronchiectasis and Beta blocker use..and one C= CHF  Adherence is always the initial "prime suspect" and is a multilayered concern that requires a "trust but verify" approach in every patient - starting with knowing how to use medications, especially inhalers, correctly, keeping up with refills and understanding the fundamental difference between maintenance and prns vs those medications only taken for a very short course and then stopped and not refilled.  - The proper method of use, as well as anticipated side effects, of a metered-dose inhaler are discussed and demonstrated to the patient. Improved effectiveness after extensive coaching during this visit to a level of approximately  90% with dpi, much lower with hfa, so try LABA/LAMA  Bronchiectasis apparent by cxr but no ct due to contrast, will not change rx for now   Needs pfts and alpha one level to complete the w/u - also prevnar if not given yet

## 2014-05-07 NOTE — Assessment & Plan Note (Signed)
-   RA sat at rest 05/06/2014 = 76% corrected on 2lpm  - 05/06/2014   Walked    x one lap @ 185 stopped due to  Sob but sats ok on 3lpm  rec 2lpm 24/7 but 3lpm with activity as of 05/06/14  Likely this is longstanding and note she denies sob with sats well below 90 at rest suggesting she has no perception of hypoxemia and should not self titrate unless she uses an oximter  Most likley this is multifactorial :  Copd/bronchiectasis/ can't r/o occult PE but already addressed on xarelto for UE DVT.  See instructions for specific recommendations which were reviewed directly with the patient who was given a copy with highlighter outlining the key components.

## 2014-05-09 NOTE — Progress Notes (Signed)
Quick Note:  Spoke with pt and notified of results per Dr. Wert. Pt verbalized understanding and denied any questions.  ______ 

## 2014-05-19 ENCOUNTER — Other Ambulatory Visit: Payer: Self-pay | Admitting: Internal Medicine

## 2014-06-08 ENCOUNTER — Other Ambulatory Visit: Payer: Self-pay | Admitting: Internal Medicine

## 2014-06-08 DIAGNOSIS — R0989 Other specified symptoms and signs involving the circulatory and respiratory systems: Principal | ICD-10-CM

## 2014-06-08 DIAGNOSIS — R0609 Other forms of dyspnea: Secondary | ICD-10-CM

## 2014-06-09 ENCOUNTER — Ambulatory Visit (INDEPENDENT_AMBULATORY_CARE_PROVIDER_SITE_OTHER): Payer: Medicare Other | Admitting: Internal Medicine

## 2014-06-09 ENCOUNTER — Other Ambulatory Visit: Payer: Medicare Other

## 2014-06-09 ENCOUNTER — Encounter: Payer: Self-pay | Admitting: Internal Medicine

## 2014-06-09 VITALS — BP 120/80 | HR 70 | Temp 97.9°F | Ht 62.0 in | Wt 152.0 lb

## 2014-06-09 DIAGNOSIS — J9612 Chronic respiratory failure with hypercapnia: Secondary | ICD-10-CM

## 2014-06-09 DIAGNOSIS — J449 Chronic obstructive pulmonary disease, unspecified: Secondary | ICD-10-CM

## 2014-06-09 DIAGNOSIS — J9610 Chronic respiratory failure, unspecified whether with hypoxia or hypercapnia: Secondary | ICD-10-CM

## 2014-06-09 DIAGNOSIS — R0609 Other forms of dyspnea: Secondary | ICD-10-CM

## 2014-06-09 DIAGNOSIS — R0989 Other specified symptoms and signs involving the circulatory and respiratory systems: Principal | ICD-10-CM

## 2014-06-09 LAB — PULMONARY FUNCTION TEST
DL/VA % PRED: 32 %
DL/VA: 1.48 ml/min/mmHg/L
DLCO UNC: 6.16 ml/min/mmHg
DLCO unc % pred: 28 %
FEF 25-75 Post: 0.32 L/sec
FEF 25-75 Pre: 0.25 L/sec
FEF2575-%Change-Post: 29 %
FEF2575-%PRED-PRE: 12 %
FEF2575-%Pred-Post: 16 %
FEV1-%CHANGE-POST: 15 %
FEV1-%Pred-Post: 32 %
FEV1-%Pred-Pre: 28 %
FEV1-POST: 0.71 L
FEV1-Pre: 0.62 L
FEV1FVC-%CHANGE-POST: 4 %
FEV1FVC-%Pred-Pre: 46 %
FEV6-%CHANGE-POST: 12 %
FEV6-%PRED-PRE: 58 %
FEV6-%Pred-Post: 65 %
FEV6-PRE: 1.6 L
FEV6-Post: 1.79 L
FEV6FVC-%Change-Post: 2 %
FEV6FVC-%PRED-POST: 99 %
FEV6FVC-%PRED-PRE: 97 %
FVC-%Change-Post: 9 %
FVC-%PRED-POST: 66 %
FVC-%Pred-Pre: 60 %
FVC-POST: 1.9 L
FVC-PRE: 1.73 L
POST FEV6/FVC RATIO: 95 %
Post FEV1/FVC ratio: 37 %
Pre FEV1/FVC ratio: 36 %
Pre FEV6/FVC Ratio: 93 %
RV % PRED: 162 %
RV: 3.3 L
TLC % PRED: 114 %
TLC: 5.42 L

## 2014-06-09 NOTE — Progress Notes (Signed)
PFT done today. 

## 2014-06-09 NOTE — Patient Instructions (Addendum)
Continue advair twice daily   02 recs is 24/7  Rest  2lpm  Walking 3lpm  Sleeping = 2lpm   Please see patient coordinator before you leave today  to schedule 02 titration and see if eligible for more portable system  Please remember to go to the lab   department downstairs for your tests - we will call you with the results when they are available.  Please schedule a follow up visit in 3 months but call sooner if needed

## 2014-06-09 NOTE — Assessment & Plan Note (Addendum)
-   02 dep 24/7 as of 05/06/14  - cor pulmonale by echo, mild,  03/25/14  - trial of anoro 05/06/14 > no better so resumed advair  - PFT's 06/09/2014 FEV1 0.71 (32%) p 15% better from saba, dlco 28% predicted with dlco 28%  Compliance will be a major issue in a pt with such little insight into her symptoms so best to keep maint rx simple as possible for now and rx with 02 24/7 plus just advair 250 one twice daily   Needs alpha one AT screen to be complete    Each maintenance medication was reviewed in detail including most importantly the difference between maintenance and as needed and under what circumstances the prns are to be used.  Please see instructions for details which were reviewed in writing and the patient given a copy.    F/u q 3 m

## 2014-06-09 NOTE — Progress Notes (Signed)
Subjective:   Patient ID: [name], female    DOB: [birth year]  MRN: [id number]    Brief patient profile:  80 yowm quit smoking 2010 when admitted with pna did improve to the point June 2015 could walk across a parking lot into big stores/ ok on housework/no yardwork s 02 or pulmonary rx  then admitted [facility] July 2015 abruptly ill with dx CAP >  rehab and back at home since first week aug in 2015 and referred 05/06/2014 to pulmonary clinic by Dr Jeannetta Nap for persistent hypoxemia.   Admit Date: 03/24/2014  Discharge Date:04/02/14  Recommendations for Outpatient Follow-up:  1. Please arrange follow up Dermatology if rash continues to persist 2. Please repeat BMET in 5 days 3. Please repeat CBC in 1 week 4. Upper Ext DVT positive-started on Xarelto-please have patient see hematology prior to discontinuing anticoagulation. PRIMARY DISCHARGE DIAGNOSIS:   PNEUMONIA ORGANISM NOS  C O P D  SKIN RASH  Altered mental status  Transaminitis  Acute renal failure  Acute respiratory failure  Encephalopathy acute  Left Upper Ext DVT  COPD exacerbation  SIR'S    BRIEF HOSPITAL COURSE:  Active Problems:  Altered mental status  -Secondary to metabolic encephalopathy-resolved  -CT head negative  COPD exacerbation  -significantly better, with IV Steroids, nebs and IV antibitoics. By day of discharge, not hypoxic on room air this am, lungs clear on exam  - transition to oral prednisone, c/w scheduled nebs on discharge  SIRS  -secondary to PNA  -resolved with supportive Care  CAP  -afebrile, leukocytosis slowly decreasing. All cultures negative    -Has completed 7 days of IV Antibiotics, completed 5 days of Levaquin, given rash-levaquin-stopped on 03/30/14  Acute Hypoxic resp failure  -secondary to COPD,PNA , and acute diastolic CHF  -no longer hypoxic-now on room air  -lower ext doppler-negative for DVT-but positive for SVT in the Left leg. On ASA.Spoke with Dr Dickson-[facility] on call over the  phone-since SVT in the distal thigh and not near the sapheno-femoral junction no need for anticoagulation, he recommends just ASA.However patient has Left upper ext DVT- rx Xarelto  Acute Diastolic CHF  -much more compensated, CXR done on 7/17-improved.Transitioned to oral Lasix.  -will need close monitoring of lytes as outpatient  Left Upper Ext DVT  -rx Xarelto on 7/17, please note, developed significantly worsened left upper arm swelling on 7/17-underwent Doppler Upper ext-positive for DVT noted in the axillary vein with superficial thrombus noted in the basilic and antecubital communicating veins. Also has Sup vein thrombosis in right cephalic vein  -Options for anticoagulation discussed, NOAC's vs Coumadin discussed. Bleeding risks discussed. Note daughter at bedside during this discussion. Patient at this time, prefers to be on Xarelto. Given persistent hypoxia, could have Pul Embolism, patient has a very small IV, offered to order a CT Angio chest, but at this time patient prefers not to get "stuck" again to get another IV access. Suspect we can just treat for 3-6 months, given clinical stability, suspect getting a CT Angio will not change the current management.  -Patient will require hematology evaluation prior to discontinuation of anticoagulation  Leukocytosis  -likely leukemoid reaction- see above regarding Abx. Also on steroids.  -WBC decreasing, tapering off steroids  Hyperkalemia  -on 7/16-?etiology  - treated with IV Insulin/D50/Kayexalate-resolved  ARF  -resolved  -suspect this was pre-renal  -seen by renal during her stay here  Transaminitis  -secondary to acute illness, acute hepatitis serology neg  -downtrending, Abd Ultrasound neg for  acute abnormalities, but does have cholelithiasis-suspect this is chronic and not related to transaminitis  Diarrhea  -resolved  -C Diff PCR neg  Thrombocytopenia  -secondary to acute illness  -improving-platelets up to 140 K today    Hypothyroidism  -c/w Levothyroxine  HTN  -c/w Bisoprolol  -BP controlled    05/06/2014 1st [facility] office visit/ Naomi Fitton / consult per Dr Jeannetta Nap re persistent hypoxemia Chief Complaint  Patient presents with  . Pulmonary Consult    Referred by Dr. Jeannetta Nap for hypoxia and abnormal CXR. Pt was on [facility] in 03/2014. Pt c/o DOE. Pt denies cough and CP/tightness.   advair bid and does not use neb Has 02 uses prn only Sleeps at 30 degree which is same before hosp s noct resp disturbance rec Change advair to anoro one click daily  02 recs Rest  2lpm  Walking 3lpm  Sleeping = 2lpm    06/09/2014 f/u ov/Zachari Alberta re: chronic resp failure / not compliant with 02  Q Are you short of breath when you walk  "hadn't paid any attention to it" - not sure 02 helps go farther  No difference on or off anoro in terms of doe   No obvious day to day or daytime variabilty or assoc chronic cough or cp or chest tightness, subjective wheeze overt sinus or hb symptoms. No unusual exp hx or h/o childhood pna/ asthma or knowledge of premature birth.  Sleeping ok without nocturnal  or early am exacerbation  of respiratory  c/o's or need for noct saba. Also denies any obvious fluctuation of symptoms with weather or environmental changes or other aggravating or alleviating factors except as outlined above   Current Medications, Allergies, Complete Past Medical History, Past Surgical History, Family History, and Social History were reviewed in [employer] record.  ROS  The following are not active complaints unless bolded sore throat, dysphagia, dental problems, itching, sneezing,  nasal congestion or excess/ purulent secretions, ear ache,   fever, chills, sweats, unintended wt loss, pleuritic or exertional cp, hemoptysis,  orthopnea pnd or leg swelling, presyncope, palpitations, heartburn, abdominal pain, anorexia, nausea, vomiting, diarrhea  or change in bowel or urinary habits, change in  stools or urine, dysuria,hematuria,  rash, arthralgias, visual complaints, headache, numbness weakness or ataxia or problems with walking or coordination,  change in mood/affect or memory.             Objective:   Physical Exam   amb wf / mentation slowed   06/09/2014        152  Wt Readings from Last 3 Encounters:  04/02/14 166 lb 12.8 oz (75.66 kg)  04/03/09 123 lb (55.792 kg)  11/29/08 110 lb (49.896 kg)      HEENT mild turbinate edema.  Oropharynx no thrush or excess pnd or cobblestoning.  No JVD or cervical adenopathy. Mild accessory muscle hypertrophy. Trachea midline, nl thryroid. Chest was hyperinflated by percussion with diminished breath sounds and moderate increased exp time with junky exp rhonchi L > R  Hoover sign positive at mid inspiration. Regular rate and rhythm without murmur gallop or rub or increase P2 or edema.  Abd: no hsm, nl excursion. Ext warm without cyanosis or clubbing.        CXR  05/06/2014 : Underlying emphysematous change with extensive bronchiectasis and interstitial fibrotic type change, stable. No frank edema or consolidation. Previous effusions have resolved. No demonstrable adenopathy.   Recent Labs Lab 05/06/14 1539  NA 139  K 4.1  CL 93*  CO2 34*  BUN 13  CREATININE 1.3*  GLUCOSE 84    Recent Labs Lab 05/06/14 1539  HGB 13.3  HCT 41.7  WBC 13.4*  PLT 306.0      Lab Results  Component Value Date   TSH 2.87 05/06/2014     Lab Results  Component Value Date   PROBNP 191.0* 05/06/2014        Assessment & Plan:

## 2014-06-09 NOTE — Assessment & Plan Note (Addendum)
-   RA sat at rest 05/06/2014 = 76% corrected on 2lpm  - 05/06/2014   Walked    x one lap @ 185 stopped due to  Sob but sats ok on 3lpm - hc03 34 05/06/14 c/w mild/moderate  hypercarbia  rec 2lpm 24/7 but 3lpm with activity as of 05/06/14   Re- iterated 02 is 24/7 for now requested eval for POC per dme company

## 2014-06-13 LAB — ALPHA-1-ANTITRYPSIN: A1 ANTITRYPSIN SER: 191 mg/dL (ref 83–199)

## 2014-06-15 LAB — ALPHA-1 ANTITRYPSIN PHENOTYPE: A-1 Antitrypsin: 195 mg/dL (ref 83–199)

## 2014-06-16 ENCOUNTER — Encounter: Payer: Self-pay | Admitting: Internal Medicine

## 2014-06-16 NOTE — Progress Notes (Signed)
Quick Note:  ATC, NA and no option to leave a msg, WCB ______ 

## 2014-06-16 NOTE — Progress Notes (Signed)
Quick Note:  ATC, line busy, WCB ______ 

## 2014-06-20 NOTE — Progress Notes (Signed)
Quick Note:  LMTCB ______ 

## 2014-06-20 NOTE — Progress Notes (Signed)
Quick Note:  Spoke with pt and notified of results per Dr. Wert. Pt verbalized understanding and denied any questions.  ______ 

## 2014-06-23 ENCOUNTER — Telehealth: Payer: Self-pay | Admitting: Internal Medicine

## 2014-06-23 DIAGNOSIS — J9612 Chronic respiratory failure with hypercapnia: Secondary | ICD-10-CM

## 2014-06-23 NOTE — Telephone Encounter (Signed)
lmomtcb x1 

## 2014-06-24 ENCOUNTER — Encounter: Payer: Self-pay | Admitting: Internal Medicine

## 2014-06-24 NOTE — Telephone Encounter (Signed)
Pt's daughter, Misty StanleyLisa, returned call.  Misty StanleyLisa stated the DME company came out to house and titrated pt on portable system and then advised pt that MW will make decision if to start pt on POC.  Called and spoke to TigardKim with [employer]. Selena BattenKim stated she has faxed over and dropped off the form to have MW complete. Kim stated she would refax (triage fax) the form incase it has not been received. Pt aware we will call her back to advise of the status.   Verlon AuLeslie have you seen this form?

## 2014-06-24 NOTE — Telephone Encounter (Signed)
lmtcb

## 2014-06-24 NOTE — Telephone Encounter (Signed)
Lindsay StanleyLisa (daughter) returned call [phone]

## 2014-06-24 NOTE — Telephone Encounter (Signed)
Dr Sherene SiresWert, I put the form back in your lookat for review Please advise if pt can obtain the POC or not  Her sats were 88% 4lpm pulsed

## 2014-06-24 NOTE — Telephone Encounter (Signed)
lmomtcb x 2  

## 2014-06-27 NOTE — Telephone Encounter (Signed)
The "paperwork" is in Dr Thurston HoleWert's scan folder, as it was just the pulse ox testing results on POC  See below response per Dr Sherene SiresWert [facility]  [facility]

## 2014-06-27 NOTE — Telephone Encounter (Signed)
Verlon AuLeslie, do you have the paperwork on the patient from MW? Thanks :)

## 2014-06-27 NOTE — Telephone Encounter (Signed)
Although the company may not be happy with these sats, she's a co2 retainer so 88 is fine and I would rec she get a POC pulsed at 4lpm at least on a trial basis and bring it with  Her to next ov

## 2014-06-29 NOTE — Telephone Encounter (Signed)
Spoke with Misty StanleyLisa, pt's daughter and informed that per MW order has been placed for POC with O2 pulsed at 4lpm.

## 2014-07-18 ENCOUNTER — Telehealth: Payer: Self-pay | Admitting: Internal Medicine

## 2014-07-18 NOTE — Telephone Encounter (Signed)
LMTCB x 1 

## 2014-07-19 NOTE — Telephone Encounter (Signed)
Spoke with [name] with [employer].  They did receive order for POC.  They are waiting on an approval from their corporate office and this can take up to 4-6 weeks.  Spoke with [name], pt's daughter and informed of this.  She verbalized understanding and will call [employer] back in a week or so to check on status .

## 2014-08-25 ENCOUNTER — Other Ambulatory Visit: Payer: Self-pay | Admitting: *Deleted

## 2014-09-07 ENCOUNTER — Ambulatory Visit (INDEPENDENT_AMBULATORY_CARE_PROVIDER_SITE_OTHER): Payer: Medicare Other | Admitting: Internal Medicine

## 2014-09-07 ENCOUNTER — Encounter: Payer: Self-pay | Admitting: Internal Medicine

## 2014-09-07 VITALS — BP 130/76 | HR 74 | Ht 62.0 in | Wt 153.6 lb

## 2014-09-07 DIAGNOSIS — J9612 Chronic respiratory failure with hypercapnia: Secondary | ICD-10-CM

## 2014-09-07 DIAGNOSIS — J449 Chronic obstructive pulmonary disease, unspecified: Secondary | ICD-10-CM

## 2014-09-07 NOTE — Patient Instructions (Addendum)
Please see patient coordinator before you leave today  to schedule POC 4lpm pulsed when walking outside of home   Otherwise 02 is 2lpm 24/7   Work on inhaler technique:  relax and gently blow all the way out then take a nice smooth deep breath back in, triggering the inhaler at same time you start breathing in.  Hold for up to 5 seconds if you can.  Rinse and gargle with water when done  Please schedule a follow up visit in 3 months but call sooner if needed  Late add : consider adding back lama next ov

## 2014-09-07 NOTE — Progress Notes (Signed)
Subjective:   Patient ID: [name], female    DOB: [birth year]  MRN: [id number]    Brief patient profile:  80 yowm quit smoking 2010 when admitted with pna did improve to the point June 2015 could walk across a parking lot into big stores/ ok on housework/no yardwork s 02 or pulmonary rx  then admitted [facility] July 2015 abruptly ill with dx CAP >  rehab and back at home since first week aug in 2015 and referred 05/06/2014 to pulmonary clinic by Dr Jeannetta Nap for persistent hypoxemia.   Admit Date: 03/24/2014  Discharge Date:04/02/14  Recommendations for Outpatient Follow-up:  1. Please arrange follow up Dermatology if rash continues to persist 2. Please repeat BMET in 5 days 3. Please repeat CBC in 1 week 4. Upper Ext DVT positive-started on Xarelto-please have patient see hematology prior to discontinuing anticoagulation. PRIMARY DISCHARGE DIAGNOSIS:   PNEUMONIA ORGANISM NOS  C O P D  SKIN RASH  Altered mental status  Transaminitis  Acute renal failure  Acute respiratory failure  Encephalopathy acute  Left Upper Ext DVT  COPD exacerbation  SIR'S    BRIEF HOSPITAL COURSE:  Active Problems:  Altered mental status  -Secondary to metabolic encephalopathy-resolved  -CT head negative  COPD exacerbation  -significantly better, with IV Steroids, nebs and IV antibitoics. By day of discharge, not hypoxic on room air this am, lungs clear on exam  - transition to oral prednisone, c/w scheduled nebs on discharge  SIRS  -secondary to PNA  -resolved with supportive Care  CAP  -afebrile, leukocytosis slowly decreasing. All cultures negative    -Has completed 7 days of IV Antibiotics, completed 5 days of Levaquin, given rash-levaquin-stopped on 03/30/14  Acute Hypoxic resp failure  -secondary to COPD,PNA , and acute diastolic CHF  -no longer hypoxic-now on room air  -lower ext doppler-negative for DVT-but positive for SVT in the Left leg. On ASA.Spoke with Dr Dickson-[facility] on call over the  phone-since SVT in the distal thigh and not near the sapheno-femoral junction no need for anticoagulation, he recommends just ASA.However patient has Left upper ext DVT- rx Xarelto  Acute Diastolic CHF  -much more compensated, CXR done on 7/17-improved.Transitioned to oral Lasix.  -will need close monitoring of lytes as outpatient  Left Upper Ext DVT  -rx Xarelto on 7/17, please note, developed significantly worsened left upper arm swelling on 7/17-underwent Doppler Upper ext-positive for DVT noted in the axillary vein with superficial thrombus noted in the basilic and antecubital communicating veins. Also has Sup vein thrombosis in right cephalic vein  -Options for anticoagulation discussed, NOAC's vs Coumadin discussed. Bleeding risks discussed. Note daughter at bedside during this discussion. Patient at this time, prefers to be on Xarelto. Given persistent hypoxia, could have Pul Embolism, patient has a very small IV, offered to order a CT Angio chest, but at this time patient prefers not to get "stuck" again to get another IV access. Suspect we can just treat for 3-6 months, given clinical stability, suspect getting a CT Angio will not change the current management.  -Patient will require hematology evaluation prior to discontinuation of anticoagulation  Leukocytosis  -likely leukemoid reaction- see above regarding Abx. Also on steroids.  -WBC decreasing, tapering off steroids  Hyperkalemia  -on 7/16-?etiology  - treated with IV Insulin/D50/Kayexalate-resolved  ARF  -resolved  -suspect this was pre-renal  -seen by renal during her stay here  Transaminitis  -secondary to acute illness, acute hepatitis serology neg  -downtrending, Abd Ultrasound neg for  acute abnormalities, but does have cholelithiasis-suspect this is chronic and not related to transaminitis  Diarrhea  -resolved  -C Diff PCR neg  Thrombocytopenia  -secondary to acute illness  -improving-platelets up to 140 K today    Hypothyroidism  -c/w Levothyroxine  HTN  -c/w Bisoprolol  -BP controlled    05/06/2014 1st [place] [facility] office visit/ [name] / consult per Dr Jeannetta NapElkins re persistent hypoxemia Chief Complaint  Patient presents with  . Pulmonary Consult    Referred by Dr. Jeannetta NapElkins for hypoxia and abnormal CXR. [facility] Pt c/o DOE. Pt denies cough and CP/tightness.   advair bid and does not use neb Has 02 uses prn only Sleeps at 30 degree which is same before hosp s noct resp disturbance rec Change advair to anoro one click daily  02 recs Rest  2lpm  Walking 3lpm  Sleeping = 2lpm    06/09/2014 f/u ov/[name] re: chronic resp failure / not compliant with 02  Q Are you short of breath when you walk  "hadn't paid any attention to it" - not sure 02 helps go farther  No difference on or off anoro in terms of doe rec Continue advair twice daily  02 recs is 24/7  Rest  2lpm  Walking 3lpm  Sleeping = 2lpm  Please see patient coordinator before you leave today  to schedule 02 titration and see if eligible for more portable system     09/07/2014 f/u ov/[name] re: 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 Follow-up    Pt reports her breathing is unchanged since her last visit. No new co's today.   Not limited by breathing from desired activities  With or without 02  Not using advair consistently     No obvious day to day or daytime variabilty or assoc chronic cough or cp or chest tightness, subjective wheeze overt sinus or hb symptoms. No unusual exp hx or h/o childhood pna/ asthma or knowledge of premature birth.  Sleeping ok without nocturnal  or early am exacerbation  of respiratory  c/o's or need for noct saba. Also denies any obvious fluctuation of symptoms with weather or environmental changes or other aggravating or alleviating factors except as outlined above   Current Medications, Allergies, Complete Past Medical History, Past Surgical History, Family  History, and Social History were [facility].  ROS  The following are not active complaints unless bolded sore throat, dysphagia, dental problems, itching, sneezing,  nasal congestion or excess/ purulent secretions, ear ache,   fever, chills, sweats, unintended wt loss, pleuritic or exertional cp, hemoptysis,  orthopnea pnd or leg swelling, presyncope, palpitations, heartburn, abdominal pain, anorexia, nausea, vomiting, diarrhea  or change in bowel or urinary habits, change in stools or urine, dysuria,hematuria,  rash, arthralgias, visual complaints, headache, numbness weakness or ataxia or problems with walking or coordination,  change in mood/affect or memory.             Objective:   Physical Exam   amb wf / mentation slowed   06/09/2014        152  > 09/07/2014  154  Wt Readings from Last 3 Encounters:  04/02/14 166 lb 12.8 oz (75.66 kg)  04/03/09 123 lb (55.792 kg)  11/29/08 110 lb (49.896 kg)      HEENT mild turbinate edema.  Oropharynx no thrush or excess pnd or cobblestoning.  No JVD or cervical adenopathy. Mild accessory  muscle hypertrophy. Trachea midline, nl thryroid. Chest was hyperinflated by percussion with diminished breath sounds and moderate increased exp time with junky exp rhonchi L > R  Hoover sign positive at mid inspiration. Regular rate and rhythm without murmur gallop or rub or increase P2 or edema.  Abd: no hsm, nl excursion. Ext warm without cyanosis or clubbing.        CXR  05/06/2014 : Underlying emphysematous change with extensive bronchiectasis and interstitial fibrotic type change, stable. No frank edema or consolidation. Previous effusions have resolved. No demonstrable adenopathy.   Recent Labs Lab 05/06/14 1539  NA 139  K 4.1  CL 93*  CO2 34*  BUN 13  CREATININE 1.3*  GLUCOSE 84    Recent Labs Lab 05/06/14 1539  HGB 13.3  HCT 41.7  WBC 13.4*  PLT 306.0      Lab Results  Component Value  Date   TSH 2.87 05/06/2014     Lab Results  Component Value Date   PROBNP 191.0* 05/06/2014        Assessment & Plan:

## 2014-09-10 NOTE — Assessment & Plan Note (Signed)
-   RA sat at rest 05/06/2014 = 76% corrected on 2lpm  - 05/06/2014   Walked    x one lap @ 185 stopped due to  Sob but sats ok on 3lpm - hc03 34 05/06/14 c/w mild/moderate  Hypercarbia - 06/09/2014 requested eval for POC > 06/17/14 sats 88% on 4lpm pulsed with walking   rec as of 09/07/14 : 2lpm 24/7 but  4lpm pulsed is fine as sats in the upper 80s are acceptable in a pt who is hypercabic at baseline  See instructions for specific recommendations which were reviewed directly with the patient who was given a copy with highlighter outlining the key components.

## 2014-09-12 ENCOUNTER — Telehealth: Payer: Self-pay | Admitting: Internal Medicine

## 2014-09-12 NOTE — Telephone Encounter (Signed)
lmtcb X1 for pt  

## 2014-09-13 NOTE — Telephone Encounter (Signed)
ATC x 3, busy signal, wcb 

## 2014-09-14 NOTE — Telephone Encounter (Signed)
Called and spoke with pt and she stated that she has gotten the tanks from [employer] but she is waiting on the portable oxygen and has not heard anything from [employer] about this.    Called and spoke with Selena BattenKim from [employer] and she stated that this was approved today.  They are waiting for this to come in and they will get it out to the pt.  I have called the pt to make her aware. Nothing further is needed.

## 2014-12-06 ENCOUNTER — Ambulatory Visit (INDEPENDENT_AMBULATORY_CARE_PROVIDER_SITE_OTHER): Payer: Medicare Other | Admitting: Internal Medicine

## 2014-12-06 ENCOUNTER — Encounter: Payer: Self-pay | Admitting: Internal Medicine

## 2014-12-06 VITALS — BP 142/90 | HR 74 | Wt 153.0 lb

## 2014-12-06 DIAGNOSIS — J9612 Chronic respiratory failure with hypercapnia: Secondary | ICD-10-CM | POA: Diagnosis not present

## 2014-12-06 DIAGNOSIS — J449 Chronic obstructive pulmonary disease, unspecified: Secondary | ICD-10-CM

## 2014-12-06 NOTE — Progress Notes (Signed)
Subjective:   Patient ID: [name], female    DOB: [birth year]  MRN: [id number]    Brief patient profile:  80 yowm quit smoking 2010 when admitted with pna did improve to the point June 2015 could walk across a parking lot into big stores/ ok on housework/no yardwork s 02 or pulmonary rx  then admitted [facility] July 2015 abruptly ill with dx CAP >  rehab and back at home since first week aug in 2015 and referred 05/06/2014 to pulmonary clinic by Dr Jeannetta Nap for persistent hypoxemia.   Admit Date: 03/24/2014  Discharge Date:04/02/14  Recommendations for Outpatient Follow-up:  1. Please arrange follow up Dermatology if rash continues to persist 2. Please repeat BMET in 5 days 3. Please repeat CBC in 1 week 4. Upper Ext DVT positive-started on Xarelto-please have patient see hematology prior to discontinuing anticoagulation. PRIMARY DISCHARGE DIAGNOSIS:   PNEUMONIA ORGANISM NOS  C O P D  SKIN RASH  Altered mental status  Transaminitis  Acute renal failure  Acute respiratory failure  Encephalopathy acute  Left Upper Ext DVT  COPD exacerbation  SIR'S    BRIEF HOSPITAL COURSE:  Active Problems:  Altered mental status  -Secondary to metabolic encephalopathy-resolved  -CT head negative  COPD exacerbation  -significantly better, with IV Steroids, nebs and IV antibitoics. By day of discharge, not hypoxic on room air this am, lungs clear on exam  - transition to oral prednisone, c/w scheduled nebs on discharge  SIRS  -secondary to PNA  -resolved with supportive Care  CAP  -afebrile, leukocytosis slowly decreasing. All cultures negative    -Has completed 7 days of IV Antibiotics, completed 5 days of Levaquin, given rash-levaquin-stopped on 03/30/14  Acute Hypoxic resp failure  -secondary to COPD,PNA , and acute diastolic CHF  -no longer hypoxic-now on room air  -lower ext doppler-negative for DVT-but positive for SVT in the Left leg. On ASA.Spoke with Dr Dickson-[facility] on call over the  phone-since SVT in the distal thigh and not near the sapheno-femoral junction no need for anticoagulation, he recommends just ASA.However patient has Left upper ext DVT- rx Xarelto  Acute Diastolic CHF  -much more compensated, CXR done on 7/17-improved.Transitioned to oral Lasix.  -will need close monitoring of lytes as outpatient  Left Upper Ext DVT  -rx Xarelto on 7/17, please note, developed significantly worsened left upper arm swelling on 7/17-underwent Doppler Upper ext-positive for DVT noted in the axillary vein with superficial thrombus noted in the basilic and antecubital communicating veins. Also has Sup vein thrombosis in right cephalic vein  -Options for anticoagulation discussed, NOAC's vs Coumadin discussed. Bleeding risks discussed. Note daughter at bedside during this discussion. Patient at this time, prefers to be on Xarelto. Given persistent hypoxia, could have Pul Embolism, patient has a very small IV, offered to order a CT Angio chest, but at this time patient prefers not to get "stuck" again to get another IV access. Suspect we can just treat for 3-6 months, given clinical stability, suspect getting a CT Angio will not change the current management.  -Patient will require hematology evaluation prior to discontinuation of anticoagulation  Leukocytosis  -likely leukemoid reaction- see above regarding Abx. Also on steroids.  -WBC decreasing, tapering off steroids  Hyperkalemia  -on 7/16-?etiology  - treated with IV Insulin/D50/Kayexalate-resolved  ARF  -resolved  -suspect this was pre-renal  -seen by renal during her stay here  Transaminitis  -secondary to acute illness, acute hepatitis serology neg  -downtrending, Abd Ultrasound neg for  acute abnormalities, but does have cholelithiasis-suspect this is chronic and not related to transaminitis  Diarrhea  -resolved  -C Diff PCR neg  Thrombocytopenia  -secondary to acute illness  -improving-platelets up to 140 K today    Hypothyroidism  -c/w Levothyroxine  HTN  -c/w Bisoprolol  -BP controlled    05/06/2014 1st [place] [facility] office visit/ [name] / consult per Dr Jeannetta Nap re persistent hypoxemia Chief Complaint  Patient presents with  . Pulmonary Consult    Referred by Dr. Jeannetta Nap for hypoxia and abnormal CXR. Pt was on [facility] in 03/2014. Pt c/o DOE. Pt denies cough and CP/tightness.   advair bid and does not use neb Has 02 uses prn only Sleeps at 30 degree which is same before hosp s noct resp disturbance rec Change advair to anoro one click daily  02 recs Rest  2lpm  Walking 3lpm  Sleeping = 2lpm    06/09/2014 f/u ov/[name] re: chronic resp failure / not compliant with 02  Q Are you short of breath when you walk  "hadn't paid any attention to it" - not sure 02 helps go farther  No difference on or off anoro in terms of doe rec Continue advair twice daily  02 recs is 24/7  Rest  2lpm  Walking 3lpm  Sleeping = 2lpm  Please see patient coordinator before you leave today  to schedule 02 titration and see if eligible for more portable system     09/07/2014 f/u ov/[name] re: 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 Follow-up    Pt reports her breathing is unchanged since her last visit. No new co's today.   Not limited by breathing from desired activities  With or without 02  Not using advair consistently  Please see patient coordinator before you leave today  to schedule POC 4lpm pulsed when walking outside of home  Otherwise 02 is 2lpm 24/7  Work on inhaler technique: Please schedule a follow up visit in 3 months but call sooner if needed  Late add : consider adding back lama next ov   12/06/2014 f/u ov/[name] re: GOLD III copd/ 02 desp resp failure/ non adherent  Chief Complaint  Patient presents with  . Follow-up    Last seen 08/2014. States feeling ok. No c/o SOB or wheezing voiced    Not limited by breathing from desired activities  But very sedentary and not  consistent with 02 or advair but denies needing saba     No obvious day to day or daytime variabilty or assoc chronic cough or cp or chest tightness, subjective wheeze overt sinus or hb symptoms. No unusual exp hx or h/o childhood pna/ asthma or knowledge of premature birth.  Sleeping ok without nocturnal  or early am exacerbation  of respiratory  c/o's or need for noct saba. Also denies any obvious fluctuation of symptoms with weather or environmental changes or other aggravating or alleviating factors except as outlined above   Current Medications, Allergies, Complete Past Medical History, Past Surgical History, Family History, and Social History were reviewed in [employer] record.  ROS  The following are not active complaints unless bolded sore throat, dysphagia, dental problems, itching, sneezing,  nasal congestion or excess/ purulent secretions, ear ache,   fever, chills, sweats, unintended wt loss, pleuritic or exertional cp, hemoptysis,  orthopnea pnd or leg swelling, presyncope, palpitations, heartburn, abdominal pain, anorexia, nausea, vomiting, diarrhea  or change in bowel or urinary habits, change  in stools or urine, dysuria,hematuria,  rash, arthralgias, visual complaints, headache, numbness weakness or ataxia or problems with walking or coordination,  change in mood/affect or memory.             Objective:   Physical Exam   amb wf / mentation seems a bit slow   06/09/2014        152  > 09/07/2014  154 > 12/06/2014  153 Wt Readings from Last 3 Encounters:  04/02/14 166 lb 12.8 oz (75.66 kg)  04/03/09 123 lb (55.792 kg)  11/29/08 110 lb (49.896 kg)      HEENT mild turbinate edema.  Oropharynx no thrush or excess pnd or cobblestoning.  No JVD or cervical adenopathy. Mild accessory muscle hypertrophy. Trachea midline, nl thryroid. Chest was hyperinflated by percussion with diminished breath sounds and moderate increased exp time with junky exp rhonchi L > R   Hoover sign positive at mid inspiration. Regular rate and rhythm without murmur gallop or rub or increase P2 or edema.  Abd: no hsm, nl excursion. Ext warm without cyanosis or clubbing.        CXR  05/06/2014 : Underlying emphysematous change with extensive bronchiectasis and interstitial fibrotic type change, stable. No frank edema or consolidation. Previous effusions have resolved. No demonstrable adenopathy.   Recent Labs Lab 05/06/14 1539  NA 139  K 4.1  CL 93*  CO2 34*  BUN 13  CREATININE 1.3*  GLUCOSE 84    Recent Labs Lab 05/06/14 1539  HGB 13.3  HCT 41.7  WBC 13.4*  PLT 306.0      Lab Results  Component Value Date   TSH 2.87 05/06/2014     Lab Results  Component Value Date   PROBNP 191.0* 05/06/2014        Assessment & Plan:

## 2014-12-06 NOTE — Patient Instructions (Addendum)
02 is 2lpm all the time except increase to 4lpm with walking outside   Please schedule a follow up office visit in 6 weeks, call sooner if needed with pfts on return and bring all inhalers with you

## 2014-12-11 ENCOUNTER — Encounter: Payer: Self-pay | Admitting: Internal Medicine

## 2014-12-11 NOTE — Assessment & Plan Note (Signed)
-   RA sat at rest 05/06/2014 = 76% corrected on 2lpm  - 05/06/2014   Walked    x one lap @ 185 stopped due to  Sob but sats ok on 3lpm - HC03 34 05/06/14 c/w mild/moderate  Hypercarbia - 06/09/2014 requested eval for POC > 06/17/14 sats 88% on 4lpm pulsed with walking    rec as of 09/07/14 : 2lpm 24/7 but  4lpm pulsed is fine with exertion  as sats in the upper 80s are acceptable in a pt who is hypercabic at baseline

## 2014-12-11 NOTE — Assessment & Plan Note (Signed)
-   Alpha one screening 06/09/2014 >  MM - 09/07/2014 p extensive coaching HFA effectiveness =    75%   DDX of  difficult airways management all start with A and  include Adherence, Ace Inhibitors, Acid Reflux, Active Sinus Disease, Alpha 1 Antitripsin deficiency, Anxiety masquerading as Airways dz,  ABPA,  allergy(esp in young), Aspiration (esp in elderly), Adverse effects of DPI,  Active smokers, plus two Bs  = Bronchiectasis and Beta blocker use..and one C= CHF  Adherence is always the initial "prime suspect" and is a multilayered concern that requires a "trust but verify" approach in every patient - starting with knowing how to use medications, especially inhalers, correctly, keeping up with refills and understanding the fundamental difference between maintenance and prns vs those medications only taken for a very short course and then stopped and not refilled.   The proper method of use, as well as anticipated side effects, of a metered-dose inhaler are discussed and demonstrated to the patient. Improved effectiveness after extensive coaching during this visit to a level of approximately  75%   Alpha one excluded as above    Each maintenance medication was reviewed in detail including most importantly the difference between maintenance and as needed and under what circumstances the prns are to be used.  Please see instructions for details which were reviewed in writing and the patient given a copy

## 2014-12-11 NOTE — Assessment & Plan Note (Addendum)
-   02 dep 24/7 as of 05/06/14  - cor pulmonale by echo, mild  03/25/14  - trial of anoro 05/06/14 > no better so resumed advair  - PFT's 06/09/2014 FEV1 0.71 (32%) p 15% better from saba, dlco 28% predicted with dlco 28%- Alpha one screening 06/09/2014 >  MM - 09/07/2014 p extensive coaching HFA effectiveness =    75%    I had an extended discussion with the patient reviewing all relevant studies completed to date and  lasting 15 to 20 minutes of a 25 minute visit on the following ongoing concerns:   At this point not really clear she's following any of the instructions given and concerned the lack of adequate 02 is slowing her congnitive skills which includes how to use her meds/ 02 discussed  Will try to keep it as simple as possible for now and regroup with pfts and all inhalers in 6 weeks

## 2015-01-19 ENCOUNTER — Ambulatory Visit (INDEPENDENT_AMBULATORY_CARE_PROVIDER_SITE_OTHER): Payer: Medicare Other | Admitting: Internal Medicine

## 2015-01-19 ENCOUNTER — Encounter: Payer: Self-pay | Admitting: Internal Medicine

## 2015-01-19 VITALS — BP 140/92 | HR 75 | Ht 63.0 in | Wt 157.0 lb

## 2015-01-19 DIAGNOSIS — J9612 Chronic respiratory failure with hypercapnia: Secondary | ICD-10-CM

## 2015-01-19 DIAGNOSIS — J449 Chronic obstructive pulmonary disease, unspecified: Secondary | ICD-10-CM

## 2015-01-19 LAB — PULMONARY FUNCTION TEST
DL/VA % PRED: 29 %
DL/VA: 1.34 ml/min/mmHg/L
DLCO UNC: 4.99 ml/min/mmHg
DLCO unc % pred: 23 %
FEF 25-75 POST: 0.28 L/s
FEF 25-75 Pre: 0.19 L/sec
FEF2575-%CHANGE-POST: 48 %
FEF2575-%PRED-POST: 14 %
FEF2575-%Pred-Pre: 9 %
FEV1-%Change-Post: 18 %
FEV1-%PRED-POST: 26 %
FEV1-%Pred-Pre: 22 %
FEV1-Post: 0.57 L
FEV1-Pre: 0.48 L
FEV1FVC-%Change-Post: -2 %
FEV1FVC-%Pred-Pre: 44 %
FEV6-%CHANGE-POST: 24 %
FEV6-%PRED-PRE: 47 %
FEV6-%Pred-Post: 58 %
FEV6-POST: 1.59 L
FEV6-Pre: 1.27 L
FEV6FVC-%Change-Post: 3 %
FEV6FVC-%Pred-Post: 97 %
FEV6FVC-%Pred-Pre: 94 %
FVC-%CHANGE-POST: 20 %
FVC-%PRED-POST: 60 %
FVC-%PRED-PRE: 50 %
FVC-PRE: 1.42 L
FVC-Post: 1.72 L
PRE FEV6/FVC RATIO: 90 %
Post FEV1/FVC ratio: 33 %
Post FEV6/FVC ratio: 93 %
Pre FEV1/FVC ratio: 34 %
RV % PRED: 157 %
RV: 3.2 L
TLC % pred: 108 %
TLC: 5.14 L

## 2015-01-19 MED ORDER — UMECLIDINIUM BROMIDE 62.5 MCG/INH IN AEPB: 1.0000 | INHALATION_SPRAY | Freq: Every morning | RESPIRATORY_TRACT | Status: DC

## 2015-01-19 NOTE — Progress Notes (Addendum)
Subjective:   Patient ID: [name], female    DOB: 04-19-1946  MRN: [id number]    Brief patient profile:  [age] yowm quit smoking 2010 when admitted with pna did improve to the point June 2015 could walk across a parking lot into big stores/ ok on housework/no yardwork s 02 or pulmonary rx  then admitted [facility] July 2015 abruptly ill with dx CAP >  rehab and back at home since first week aug in 2015 and referred 05/06/2014 to pulmonary clinic by Dr Jeannetta NapElkins for persistent hypoxemia with 01/19/2015 pfts c/w GOLD IV with reversibility    Admit Date: 03/24/2014  Discharge Date:04/02/14  PRIMARY DISCHARGE DIAGNOSIS:   PNEUMONIA ORGANISM NOS  C O P D  SKIN RASH  Altered mental status  Transaminitis  Acute renal failure  Acute respiratory failure  Encephalopathy acute  Left Upper Ext DVT  COPD exacerbation  SIR'S    BRIEF HOSPITAL COURSE:  Active Problems:  Altered mental status  -Secondary to metabolic encephalopathy-resolved  -CT head negative  COPD exacerbation  -significantly better, with IV Steroids, nebs and IV antibitoics. By day of discharge, not hypoxic on room air this am, lungs clear on exam  - transition to oral prednisone, c/w scheduled nebs on discharge  SIRS  -secondary to PNA  -resolved with supportive Care  CAP  -afebrile, leukocytosis slowly decreasing. All cultures negative    -Has completed 7 days of IV Antibiotics, completed 5 days of Levaquin, given rash-levaquin-stopped on 03/30/14  Acute Hypoxic resp failure  -secondary to COPD,PNA , and acute diastolic CHF  -no longer hypoxic-now on room air  -lower ext doppler-negative for DVT-but positive for SVT in the Left leg. On ASA.Spoke with Dr Dickson-[facility] on call over the phone-since SVT in the distal thigh and not near the sapheno-femoral junction no need for anticoagulation, he recommends just ASA.However patient has Left upper ext DVT- rx Xarelto  Acute Diastolic CHF  -much more compensated, CXR done on  7/17-improved.Transitioned to oral Lasix.  -will need close monitoring of lytes as outpatient  Left Upper Ext DVT  -rx Xarelto on 7/17, please note, developed significantly worsened left upper arm swelling on 7/17-underwent Doppler Upper ext-positive for DVT noted in the axillary vein with superficial thrombus noted in the basilic and antecubital communicating veins. Also has Sup vein thrombosis in right cephalic vein  -Options for anticoagulation discussed, NOAC's vs Coumadin discussed. Bleeding risks discussed. Note daughter at bedside during this discussion. Patient at this time, prefers to be on Xarelto. Given persistent hypoxia, could have Pul Embolism, patient has a very small IV, offered to order a CT Angio chest, but at this time patient prefers not to get "stuck" again to get another IV access. Suspect we can just treat for 3-6 months, given clinical stability, suspect getting a CT Angio will not change the current management.  -Patient will require hematology evaluation prior to discontinuation of anticoagulation  Leukocytosis  -likely leukemoid reaction- see above regarding Abx. Also on steroids.  -WBC decreasing, tapering off steroids  Hyperkalemia  -on 7/16-?etiology  - treated with IV Insulin/D50/Kayexalate-resolved  ARF  -resolved  -suspect this was pre-renal  -seen by renal during her stay here  Transaminitis  -secondary to acute illness, acute hepatitis serology neg  -downtrending, Abd Ultrasound neg for acute abnormalities, but does have cholelithiasis-suspect this is chronic and not related to transaminitis  Diarrhea  -resolved  -C Diff PCR neg  Thrombocytopenia  -secondary to acute illness  -improving-platelets up to 140 K  today  Hypothyroidism  -c/w Levothyroxine  HTN  -c/w Bisoprolol  -BP controlled    05/06/2014 1st [facility] office visit/ Aarohi Redditt / consult per Dr Jeannetta Nap re persistent hypoxemia Chief Complaint  Patient presents with  . Pulmonary Consult      Referred by Dr. Jeannetta Nap for hypoxia and abnormal CXR. Pt was on [facility] in 03/2014. Pt c/o DOE. Pt denies cough and CP/tightness.   advair bid and does not use neb Has 02 uses prn only Sleeps at 30 degree which is same before hosp s noct resp disturbance rec Change advair to anoro one click daily  02 recs Rest  2lpm  Walking 3lpm  Sleeping = 2lpm    06/09/2014 f/u ov/Reanna Scoggin re: chronic resp failure / not compliant with 02  Q Are you short of breath when you walk  "hadn't paid any attention to it" - not sure 02 helps go farther  No difference on or off anoro in terms of doe rec Continue advair twice daily  02 recs is 24/7  Rest  2lpm  Walking 3lpm  Sleeping = 2lpm  Please see patient coordinator before you leave today  to schedule 02 titration and see if eligible for more portable system     09/07/2014 f/u ov/Torien Ramroop re: 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 Follow-up    Pt reports her breathing is unchanged since her last visit. No new co's today.   Not limited by breathing from desired activities  With or without 02  Not using advair consistently  Please see patient coordinator before you leave today  to schedule POC 4lpm pulsed when walking outside of home  Otherwise 02 is 2lpm 24/7  Work on inhaler technique: Please schedule a follow up visit in 3 months but call sooner if needed  Late add : consider adding back lama next ov   12/06/2014 f/u ov/Tayt Moyers re: GOLD III copd/ 02 desp resp failure/ non adherent  Chief Complaint  Patient presents with  . Follow-up    Last seen 08/2014. States feeling ok. No c/o SOB or wheezing voiced    Not limited by breathing from desired activities  But very sedentary and not consistent with 02 or advair but denies needing saba  rec 02 is 2lpm all the time except increase to 4lpm with walking outside  Please schedule a follow up office visit in 6 weeks, call sooner if needed with pfts on return and bring all inhalers with  you     01/19/2015 f/u ov/Tanasha Menees re: COPD GOLD IV on advair and no need for saba  Chief Complaint  Patient presents with  . COPD    Breathing is unchanged since last OV. No new complaints today.    Doe x [employer] shopping s stopping 4lpm doesn't use HC parking - MMRC1  No housework  No need for saba      No obvious day to day or daytime variabilty or assoc chronic cough or cp or chest tightness, subjective wheeze overt sinus or hb symptoms. No unusual exp hx or h/o childhood pna/ asthma or knowledge of premature birth.  Sleeping ok without nocturnal  or early am exacerbation  of respiratory  c/o's or need for noct saba. Also denies any obvious fluctuation of symptoms with weather or environmental changes or other aggravating or alleviating factors except as outlined above   Current Medications, Allergies, Complete Past Medical History, Past Surgical History, Family History, and Social History were reviewed  in [facility] electronic medical record.  ROS  The following are not active complaints unless bolded sore throat, dysphagia, dental problems, itching, sneezing,  nasal congestion or excess/ purulent secretions, ear ache,   fever, chills, sweats, unintended wt loss, pleuritic or exertional cp, hemoptysis,  orthopnea pnd or leg swelling, presyncope, palpitations, heartburn, abdominal pain, anorexia, nausea, vomiting, diarrhea  or change in bowel or urinary habits, change in stools or urine, dysuria,hematuria,  rash, arthralgias, visual complaints, headache, numbness weakness or ataxia or problems with walking or coordination,  change in mood/affect or memory.             Objective:   Physical Exam   amb wf / mentation seems a bit slow   06/09/2014        152  > 09/07/2014  154 > 12/06/2014  153 >01/19/2015  159  Wt Readings from Last 3 Encounters:  04/02/14 166 lb 12.8 oz (75.66 kg)  04/03/09 123 lb (55.792 kg)  11/29/08 110 lb (49.896 kg)      HEENT mild turbinate edema.   Oropharynx no thrush or excess pnd or cobblestoning.  No JVD or cervical adenopathy. Mild accessory muscle hypertrophy. Trachea midline, nl thryroid. Chest was hyperinflated by percussion with diminished breath sounds and moderate increased exp time with junky exp rhonchi L > R  Hoover sign positive at mid inspiration. Regular rate and rhythm without murmur gallop or rub or increase P2 or edema.  Abd: no hsm, nl excursion. Ext warm without cyanosis or clubbing.        CXR  05/06/2014 : Underlying emphysematous change with extensive bronchiectasis and interstitial fibrotic type change, stable. No frank edema or consolidation. Previous effusions have resolved. No demonstrable adenopathy.               Assessment & Plan:

## 2015-01-19 NOTE — Patient Instructions (Addendum)
Add Incruse one puff each am to the advair each am only to see if can walk at faster pace or easier back up to your appt and if not improving after 2 weeks stop it  Please schedule a follow up visit in 3 months but call sooner if needed with cxr on return

## 2015-01-19 NOTE — Progress Notes (Signed)
PFT done today. 

## 2015-01-22 ENCOUNTER — Encounter: Payer: Self-pay | Admitting: Internal Medicine

## 2015-01-23 ENCOUNTER — Encounter: Payer: Self-pay | Admitting: Internal Medicine

## 2015-01-23 NOTE — Assessment & Plan Note (Addendum)
-   02 dep 24/7 as of 05/06/14  - cor pulmonale by echo, mild,  03/25/14  - trial of anoro 05/06/14 > no better so resumed advair  - PFT's 06/09/2014 FEV1 0.71 (32%) p 15% better from saba, dlco 28% predicted with dlco 28% - Alpha one screening 06/09/2014 >  MM - 09/07/2014 p extensive coaching HFA effectiveness =    75%  - PFTs 01/19/15  FEV1  0.57 (26%) ratio 33 p 18% improvement from saba, dlco 23 corrects to 29%   I had an extended discussion with the patient reviewing all relevant studies completed to date and  lasting 15 to 20 minutes of a 25 minute visit on the following ongoing concerns:   1) despite her minimal doe hx, she has very severe dopd and should benefit from  LAMA in some form plus the advair   2) The proper method of use, as well as anticipated side effects, of a metered-dose inhaler / Elipta device are discussed and demonstrated to the patient. Improved effectiveness after extensive coaching during this visit to a level of approximately  90% > try incruse one daily   3) Each maintenance medication was reviewed in detail including most importantly the difference between maintenance and as needed and under what circumstances the prns are to be used.  Please see instructions for details which were reviewed in writing and the patient given a copy.

## 2015-01-23 NOTE — Assessment & Plan Note (Signed)
-   RA sat at rest 05/06/2014 = 76% corrected on 2lpm  - 05/06/2014   Walked    x one lap @ 185 stopped due to  Sob but sats ok on 3lpm - hc03 34 05/06/14 c/w mild/moderate  Hypercarbia - 06/09/2014 requested eval for POC > 06/17/14 sats 88% on 4lpm pulsed with walking    rec as of 09/07/14 : 2lpm 24/7 but  4lpm pulsed is fine with exertion  as sats in the upper 80s are acceptable in a pt who is hypercabic at baseline  No change rx/ her hypercarbia is helping lower her Ve demand, no specific rx needed

## 2015-04-25 ENCOUNTER — Encounter: Payer: Self-pay | Admitting: Internal Medicine

## 2015-04-25 ENCOUNTER — Ambulatory Visit (INDEPENDENT_AMBULATORY_CARE_PROVIDER_SITE_OTHER)
Admission: RE | Admit: 2015-04-25 | Discharge: 2015-04-25 | Disposition: A | Discharge status: Home / Self Care | Payer: Medicare Other | Source: Ambulatory Visit | Attending: Internal Medicine | Admitting: Internal Medicine

## 2015-04-25 ENCOUNTER — Ambulatory Visit (INDEPENDENT_AMBULATORY_CARE_PROVIDER_SITE_OTHER): Payer: Medicare Other | Admitting: Internal Medicine

## 2015-04-25 VITALS — BP 138/78 | HR 77 | Ht 63.5 in | Wt 160.6 lb

## 2015-04-25 DIAGNOSIS — J449 Chronic obstructive pulmonary disease, unspecified: Secondary | ICD-10-CM

## 2015-04-25 DIAGNOSIS — J9612 Chronic respiratory failure with hypercapnia: Secondary | ICD-10-CM | POA: Diagnosis not present

## 2015-04-25 NOTE — Progress Notes (Signed)
Quick Note:  LMTCB ______ 

## 2015-04-25 NOTE — Progress Notes (Signed)
Subjective:   Patient ID: [name], female    DOB: 04-19-1946  MRN: [id number]    Brief patient profile:  [age] yowm quit smoking 2010 when admitted with pna did improve to the point June 2015 could walk across a parking lot into big stores/ ok on housework/no yardwork s 02 or pulmonary rx  then admitted [facility] July 2015 abruptly ill with dx CAP >  rehab and back at home since first week aug in 2015 and referred 05/06/2014 to pulmonary clinic by Dr [name] for persistent hypoxemia with 01/19/2015 pfts c/w GOLD IV with reversibility    Admit Date: 03/24/2014  Discharge Date:04/02/14  PRIMARY DISCHARGE DIAGNOSIS:   PNEUMONIA ORGANISM NOS  C O P D  SKIN RASH  Altered mental status  Transaminitis  Acute renal failure  Acute respiratory failure  Encephalopathy acute  Left Upper Ext DVT  COPD exacerbation  SIR'S    BRIEF HOSPITAL COURSE:  Active Problems:  Altered mental status  -Secondary to metabolic encephalopathy-resolved  -CT head negative  COPD exacerbation  -significantly better, with IV Steroids, nebs and IV antibitoics. By day of discharge, not hypoxic on room air this am, lungs clear on exam  - transition to oral prednisone, c/w scheduled nebs on discharge  SIRS  -secondary to PNA  -resolved with supportive Care  CAP  -afebrile, leukocytosis slowly decreasing. All cultures negative    -Has completed 7 days of IV Antibiotics, completed 5 days of Levaquin, given rash-levaquin-stopped on 03/30/14  Acute Hypoxic resp failure  -secondary to COPD,PNA , and acute diastolic CHF  -no longer hypoxic-now on room air  -lower ext doppler-negative for DVT-but positive for SVT in the Left leg. On ASA.Spoke with Dr [name]-VVS on call over the phone-since SVT in the distal thigh and not near the sapheno-femoral junction no need for anticoagulation, he recommends just ASA.However patient has Left upper ext DVT- rx Xarelto  Acute Diastolic CHF  -much more compensated, CXR done on  7/17-improved.Transitioned to oral Lasix.  -will need close monitoring of lytes as outpatient  Left Upper Ext DVT  -rx Xarelto on 7/17, please note, developed significantly worsened left upper arm swelling on 7/17-underwent Doppler Upper ext-positive for DVT noted in the axillary vein with superficial thrombus noted in the basilic and antecubital communicating veins. Also has Sup vein thrombosis in right cephalic vein  -Options for anticoagulation discussed, NOAC's vs Coumadin discussed. Bleeding risks discussed. Note daughter at bedside during this discussion. Patient at this time, prefers to be on Xarelto. Given persistent hypoxia, could have Pul Embolism, patient has a very small IV, offered to order a CT Angio chest, but at this time patient prefers not to get "stuck" again to get another IV access. Suspect we can just treat for 3-6 months, given clinical stability, suspect getting a CT Angio will not change the current management.  -Patient will require hematology evaluation prior to discontinuation of anticoagulation  Leukocytosis  -likely leukemoid reaction- see above regarding Abx. Also on steroids.  -WBC decreasing, tapering off steroids  Hyperkalemia  -on 7/16-?etiology  - treated with IV Insulin/D50/Kayexalate-resolved  ARF  -resolved  -suspect this was pre-renal  -seen by renal during her stay here  Transaminitis  -secondary to acute illness, acute hepatitis serology neg  -downtrending, Abd Ultrasound neg for acute abnormalities, but does have cholelithiasis-suspect this is chronic and not related to transaminitis  Diarrhea  -resolved  -C Diff PCR neg  Thrombocytopenia  -secondary to acute illness  -improving-platelets up to 140 K  today  Hypothyroidism  -c/w Levothyroxine  HTN  -c/w Bisoprolol  -BP controlled  12/06/2014 f/u ov/[name] re: GOLD III copd/ 02 desp resp failure/ non adherent  Chief Complaint  Patient presents with  . Follow-up    Last seen 08/2014. States  feeling ok. No c/o SOB or wheezing voiced    Not limited by breathing from desired activities  But very sedentary and not consistent with 02 or advair but denies needing saba  rec 02 is 2lpm all the time except increase to 4lpm with walking outside  Please schedule a follow up office visit in 6 weeks, call sooner if needed with pfts on return and bring all inhalers with you     01/19/2015 f/u ov/[name] re: COPD GOLD IV on advair and no need for saba  Chief Complaint  Patient presents with  . COPD    Breathing is unchanged since last OV. No new complaints today.  Doe x [employer] shopping s stopping 4lpm doesn't use HC parking - MMRC1  No housework  No need for saba  rec Add Incruse one puff each am to the advair each am only to see if can walk at faster pace or easier back up to your appt and if not improving after 2 weeks stop it Please schedule a follow up visit in 3 months but call sooner if needed with cxr on return      04/25/2015 f/u ov/[name] re: copd GOLD IV/ 02 dep/no worse off all rx but 02 / not using saba either  Chief Complaint  Patient presents with  . Follow-up    Had cxr today-not feeling any different,no cough,no wheeze,clears throat a lot-dry.Not using Advair or Incruse.  not using any inhalers at all at this point, Not limited by breathing from desired activities  But very sedentary    No obvious day to day or daytime variabilty or assoc chronic cough or cp or chest tightness, subjective wheeze overt sinus or hb symptoms. No unusual exp hx or h/o childhood pna/ asthma or knowledge of premature birth.  Sleeping ok without nocturnal  or early am exacerbation  of respiratory  c/o's or need for noct saba. Also denies any obvious fluctuation of symptoms with weather or environmental changes or other aggravating or alleviating factors except as outlined above   Current Medications, Allergies, Complete Past Medical History, Past Surgical History, Family History, and Social History  were reviewed in [employer] record.  ROS  The following are not active complaints unless bolded sore throat, dysphagia, dental problems, itching, sneezing,  nasal congestion or excess/ purulent secretions, ear ache,   fever, chills, sweats, unintended wt loss, pleuritic or exertional cp, hemoptysis,  orthopnea pnd or leg swelling, presyncope, palpitations, heartburn, abdominal pain, anorexia, nausea, vomiting, diarrhea  or change in bowel or urinary habits, change in stools or urine, dysuria,hematuria,  rash, arthralgias, visual complaints, headache, numbness weakness or ataxia or problems with walking or coordination,  change in mood/affect or memory.             Objective:   Physical Exam   amb wf / mentation seems a bit slow   06/09/2014        152  > 09/07/2014  154 > 12/06/2014  153 >01/19/2015  159 > 04/25/2015    161  Wt Readings from Last 3 Encounters:  04/02/14 166 lb 12.8 oz (75.66 kg)  04/03/09 123 lb (55.792 kg)  11/29/08 110 lb (49.896 kg)  HEENT mild turbinate edema.  Oropharynx no thrush or excess pnd or cobblestoning.  No JVD or cervical adenopathy. Mild accessory muscle hypertrophy. Trachea midline, nl thryroid. Chest was hyperinflated by percussion with diminished breath sounds and moderate increased exp time with junky exp rhonchi L > R  Hoover sign positive at mid inspiration. Regular rate and rhythm without murmur gallop or rub or increase P2 or edema.  Abd: no hsm, nl excursion. Ext warm without cyanosis or clubbing.         I personally reviewed images and agree with radiology impression as follows:  CXR:  04/25/15 Hyperinflation and pulmonary parenchymal scarring. No acute findings.                Assessment & Plan:

## 2015-04-25 NOTE — Patient Instructions (Addendum)
No change in recommendations - you do not appear to need a maintenance treatment plan other than 02 which you should use 24/7 :  2lpm at rest/ sleep and 4lpm walking via your POC   Only use your albuterol as a rescue medication to be used if you can't catch your breath by resting or doing a relaxed purse lip breathing pattern.  - The less you use it, the better it will work when you need it. - Ok to use up to 2 puffs  every 4 hours if you must but call for immediate appointment if use goes up over your usual need - Don't leave home without it !!  (think of it like the spare tire for your car)   Follow up can be yearly if needed to recertify for 02/ otherwise just see [place] back here as needed  Late add Needs repeat poc titration with any sat above 85% ok

## 2015-04-26 ENCOUNTER — Telehealth: Payer: Self-pay | Admitting: *Deleted

## 2015-04-26 ENCOUNTER — Encounter: Payer: Self-pay | Admitting: Internal Medicine

## 2015-04-26 NOTE — Telephone Encounter (Signed)
LMTCB

## 2015-04-26 NOTE — Assessment & Plan Note (Addendum)
-   02 dep 24/7 as of 05/06/14  - cor pulmonale by echo, mild,  03/25/14  - trial of anoro 05/06/14 > no better so resumed advair  - PFT's 06/09/2014 FEV1 0.71 (32%) p 15% better from saba, dlco 28% predicted with dlco 28% - Alpha one screening 06/09/2014 >  MM - 09/07/2014 p extensive coaching HFA effectiveness =    75%  - PFTs 01/19/15  FEV1  0.57 (26%) ratio 33 p 18% improvement from saba, dlco 23 corrects to 29%  - no better with lama/laba/ics so D/c'd 02/2015 and no worse off   Though somewhat paradoxic, when the lung fails to clear C02 properly and pC02 rises the lung then becomes a more efficient scavenger of C02 allowing lower work of breathing and  better C02 clearance albeit at a higher serum pC02 level - this is why pts can look a lot better than their ABG's would suggest and why it's so difficult to prognosticate endstage dz.  It's also why I strongly rec DNI status (ventilating pts down to a nl pC02 adversely affects this compensatory mechanism)   For now therefore just rec prn saba   I had an extended discussion with the patient reviewing all relevant studies completed to date and  lasting 15 to 20 minutes of a 25 minute visit    Each maintenance medication was reviewed in detail including most importantly the difference between maintenance and prns and under what circumstances the prns are to be triggered using an action plan format that is not reflected in the computer generated alphabetically organized AVS.    Please see instructions for details which were reviewed in writing and the patient given a copy highlighting the part that I personally wrote and discussed at today's ov.

## 2015-04-26 NOTE — Telephone Encounter (Signed)
-----   Message from Nyoka Cowden, MD sent at 04/26/2015  1:06 PM EDT ----- Needs repeat poc titration with any sat above 85% ok

## 2015-04-26 NOTE — Assessment & Plan Note (Signed)
-   06/09/2014 requested eval for POC > 06/17/14 sats 88% on 4lpm pulsed with walking  - 04/25/2015   Walked 4lpm  x one lap @ 185 stopped due to  sats down to 80% s sob    rec as of 04/25/15 : 2lpm 24/7 but  4lpm pulsed with activity pending repeat 02 titration

## 2015-04-26 NOTE — Progress Notes (Signed)
Quick Note:  lmtcb ______ 

## 2015-04-28 ENCOUNTER — Encounter: Payer: Self-pay | Admitting: *Deleted

## 2015-04-28 NOTE — Telephone Encounter (Signed)
lmtcb x2 

## 2015-04-28 NOTE — Progress Notes (Signed)
Quick Note:    Letter mailed.  ______

## 2015-05-04 NOTE — Telephone Encounter (Signed)
lmomtcb x 3  

## 2015-05-09 NOTE — Telephone Encounter (Signed)
This was already completed, will close encounter

## 2015-05-12 ENCOUNTER — Telehealth: Payer: Self-pay | Admitting: Internal Medicine

## 2015-05-12 NOTE — Telephone Encounter (Signed)
LMTC x 1  

## 2015-05-15 ENCOUNTER — Telehealth: Payer: Self-pay | Admitting: Internal Medicine

## 2015-05-15 DIAGNOSIS — J449 Chronic obstructive pulmonary disease, unspecified: Secondary | ICD-10-CM

## 2015-05-15 NOTE — Telephone Encounter (Signed)
lmtcb

## 2015-05-15 NOTE — Telephone Encounter (Signed)
Spoke with pt and advised of cxr results per Dr Sherene Sires from 04/25/15.

## 2015-05-15 NOTE — Telephone Encounter (Signed)
[phone] or [phone], pt cb

## 2015-05-16 ENCOUNTER — Other Ambulatory Visit: Payer: Self-pay | Admitting: Internal Medicine

## 2015-05-16 DIAGNOSIS — J449 Chronic obstructive pulmonary disease, unspecified: Secondary | ICD-10-CM

## 2015-05-16 NOTE — Addendum Note (Signed)
Addended by: Nicanor Alcon on: 05/16/2015 02:33 PM   Modules accepted: Orders

## 2015-05-16 NOTE — Telephone Encounter (Signed)
Called spoke with Selena Batten with [facility]. She reports pt had POC and they did a titration and she still drops into the 70's. Pt needs to be switched back to tanks on a cont flow. Per Selena Batten they sent this report over to Dr. Sherene Sires. Please advise thanks

## 2015-05-16 NOTE — Telephone Encounter (Signed)
That's fine > approve whatever level of 02 with exertion maintains her above 90% with whatever system that achieves this

## 2015-05-16 NOTE — Telephone Encounter (Signed)
Called and spoke to [place]. Informed her of the recs per MW. Order placed. Kim verbalized understanding and denied any further questions or concerns at this time.

## 2015-05-29 ENCOUNTER — Other Ambulatory Visit: Payer: Medicare Other

## 2015-05-29 ENCOUNTER — Encounter: Payer: Self-pay | Admitting: Hematology

## 2015-05-29 ENCOUNTER — Ambulatory Visit (HOSPITAL_BASED_OUTPATIENT_CLINIC_OR_DEPARTMENT_OTHER): Payer: Medicare Other | Admitting: Hematology

## 2015-05-29 VITALS — BP 134/98 | HR 84 | Temp 98.0°F | Resp 19 | Ht 63.5 in | Wt 163.4 lb

## 2015-05-29 DIAGNOSIS — I82409 Acute embolism and thrombosis of unspecified deep veins of unspecified lower extremity: Secondary | ICD-10-CM

## 2015-05-29 DIAGNOSIS — Z7189 Other specified counseling: Secondary | ICD-10-CM

## 2015-05-29 NOTE — Progress Notes (Signed)
Marland Kitchen    HEMATOLOGY/ONCOLOGY CONSULTATION NOTE  Date of Service: 05/29/2015  Patient Care Team: Kaleen Mask, MD as PCP - General (Family Medicine)  CHIEF COMPLAINTS/PURPOSE OF CONSULTATION:  History of DVT on anticoagulation.  HISTORY OF PRESENTING ILLNESS:  [name] is a wonderful 69 y.o. female who has been referred to [place] by Dr .Kaleen Mask, MD for recommendations regarding duration of anticoagulation is patient with previous history of DVT.  [name] has a history of hypertension, COPD, CHF who was admitted to the hospital in July 2015 with altered mental status secondary to metabolic encephalopathy , COPD exacerbation caused by a pneumonia, acute diastolic CHF. During that hospitalization patient notes that she had a left upper extremity IV line. She was noted to have subsequently developed a left upper extremity DVT in the axillary vein and superficial vein thrombosis in the right and left upper extremity veins. She also had ultrasound of her lower extremity which showed no evidence of DVT or superficial thrombosis in the right lower extremity. No evidence of DVT in the left lower extremity but positive for superficial thrombosis involving the left lower extremity. No evidence of Baker's cyst.  Patient was placed in Xarelto at the time and has continued to be on it for nearly 14 months now. She notes that she is not sure why her anticoagulation has been continued for this long and is very keen to get off the Xarelto. She notes some issues with left upper extremity swelling if she uses the left upper extremity for long time. No upper or lower extremity swelling at rest. No redness or pain. She notes she has no family history of hypercoagulable states or thrombosis. She has no personal history of VTE other than the provoked event in July 2015 when she was hospitalized with a pneumonia and had a left upper extremity IV line. She has had about 3-5 years of oral  contraceptive pill usage when she was younger and had 3 pregnancies without any issues with venous thromboembolism. She notes that she does not want to much testing done and just wishes to get off the blood thinners. No issues with bleeding on the anticoagulation. Does not recall ever having hypercoagulable workup previously.    MEDICAL HISTORY:  Past Medical History  Diagnosis Date  . COPD (chronic obstructive pulmonary disease)   . Hypertension   . CHF (congestive heart failure)    Left upper extremity DVT and superficial thrombosis of the bilateral upper extremities and left lower extremity  SURGICAL HISTORY: History reviewed. No pertinent past surgical history.  SOCIAL HISTORY: Social History   Social History  . Marital Status: Married    Spouse Name: N/A  . Number of Children: N/A  . Years of Education: N/A   Occupational History  . retired    Social History Main Topics  . Smoking status: Former Smoker    Quit date: 09/09/2009  . Smokeless tobacco: Never Used  . Alcohol Use: No  . Drug Use: No  . Sexual Activity: Not on file   Other Topics Concern  . Not on file   Social History Narrative    FAMILY HISTORY: Family History  Problem Relation Age of Onset  . Heart disease Mother   . Heart disease Father   . Diabetes Mother   . Diabetes Father     ALLERGIES:  is allergic to codeine and penicillins.  MEDICATIONS:  Current Outpatient Prescriptions  Medication Sig Dispense Refill  . albuterol (PROAIR HFA) 108 (90 BASE)  MCG/ACT inhaler Inhale 2 puffs into the lungs every 6 (six) hours as needed for wheezing or shortness of breath.    . bisoprolol (ZEBETA) 10 MG tablet Take 10 mg by mouth daily.    . furosemide (LASIX) 40 MG tablet Take 1.5 tablets (60 mg total) by mouth 2 (two) times daily. 30 tablet   . levothyroxine (SYNTHROID, LEVOTHROID) 50 MCG tablet Take 50 mcg by mouth daily before breakfast.    . Melatonin 3 MG TABS Take 1 tablet by mouth at bedtime.     . mirtazapine (REMERON) 30 MG tablet Take 30 mg by mouth at bedtime.    . pentoxifylline (TRENTAL) 400 MG CR tablet Take 400 mg by mouth daily.    . potassium chloride (K-DUR) 10 MEQ tablet Take 10 mEq by mouth daily.    . simvastatin (ZOCOR) 20 MG tablet Take 20 mg by mouth daily.    Marland Kitchen VITAMIN D, CHOLECALCIFEROL, PO Take 1,000 mg by mouth every 14 (fourteen) days.     Carlena Hurl 20 MG TABS tablet TAKE 1 TABLET EVERY DAY 30 tablet 0   No current facility-administered medications for this visit.    REVIEW OF SYSTEMS:    10 Point review of Systems was done is negative except as noted above.  PHYSICAL EXAMINATION: ECOG PERFORMANCE STATUS: 1 - Symptomatic but completely ambulatory  . Filed Vitals:   05/29/15 1042  Height: 5' 3.5" (1.613 m)  Weight: 163 lb 6.4 oz (74.118 kg)   Filed Weights   05/29/15 1042  Weight: 163 lb 6.4 oz (74.118 kg)   .Body mass index is 28.49 kg/(m^2).  GENERAL:alert, in no acute distress and comfortable SKIN: skin color, texture, turgor are normal, no rashes or significant lesions EYES: normal, conjunctiva are pink and non-injected, sclera clear OROPHARYNX:no exudate, no erythema and lips, buccal mucosa, and tongue normal  NECK: supple, no JVD, thyroid normal size, non-tender, without nodularity LYMPH:  no palpable lymphadenopathy in the cervical, axillary or inguinal LUNGS: clear to auscultation with normal respiratory effort HEART: regular rate & rhythm,  no murmurs and no lower extremity edema ABDOMEN: abdomen soft, non-tender, normoactive bowel sounds  Musculoskeletal: no cyanosis of digits and no clubbing  PSYCH: alert & oriented x 3 with fluent speech NEURO: no focal motor/sensory deficits  LABORATORY DATA:  I have reviewed the data as listed  . CBC Latest Ref Rng 05/06/2014 04/01/2014 03/31/2014  WBC 4.0 - 10.5 K/uL 13.4(H) 15.0(H) 20.6(H)  Hemoglobin 12.0 - 15.0 g/dL 16.1 09.6 15.5(H)  Hematocrit 36.0 - 46.0 % 41.7 45.4 47.8(H)    Platelets 150.0 - 400.0 K/uL 306.0 140(L) 128(L)    . CMP Latest Ref Rng 05/06/2014 04/01/2014 03/31/2014  Glucose 70 - 99 mg/dL 84 045(W) 098(J)  BUN 6 - 23 mg/dL 13 19(J) 47(W)  Creatinine 0.4 - 1.2 mg/dL 2.9(F) 6.21 3.08(M)  Sodium 135 - 145 mEq/L 139 144 146  Potassium 3.5 - 5.1 mEq/L 4.1 3.4(L) 3.9  Chloride 96 - 112 mEq/L 93(L) 100 101  CO2 19 - 32 mEq/L 34(H) 33(H) 32  Calcium 8.4 - 10.5 mg/dL 57.8 4.6(N) 8.4  Total Protein 6.0 - 8.3 g/dL - 5.3(L) -  Total Bilirubin 0.3 - 1.2 mg/dL - 0.7 -  Alkaline Phos 39 - 117 U/L - 103 -  AST 0 - 37 U/L - 35 -  ALT 0 - 35 U/L - 169(H) -     RADIOGRAPHIC STUDIES: I have personally reviewed the radiological images as listed and agreed with the  findings in the report. No results found.  ASSESSMENT & PLAN:   69 year old female with  #1 left upper extremity DVT triggered by IV line in the setting of hospitalization for pneumonia and sepsis. She also had some superficial thrombophlebitis in her upper and lower extremities. No family history of hypercoagulability. No personal history of venous thromboembolic as an despite multiple triggers including oral contraceptive pill use and 3 pregnancies. Patient has been treated with Rivaroxaban as her anticoagulant for nearly 14 months. She has had no issues with bleeding and no symptoms of new venous thromboembolism.  Patient claims that she might have had additional ultrasounds outside of our hospital that did show DVT in her lower extremities at the same time last July. We are unable to substantiate this due to lack of those ultrasound results available to [place]. Plan -Patient has no strong indicators and has a preference not to be evaluated for additional hereditary or acquired factors causing hypercoagulability. So no additional testing towards this will be ordered. -Patient appears to have had more than adequate anticoagulation for her triggered left upper extremity DVT which would typically be 3-6  months. -Given presence of some symptoms of post-thrombotic syndrome and the uncertainty caused by the patient's record of having additional lower extremity DVTs at the same time (unsubstantitated based on available records) might consider adding repeat ultrasound of her upper and lower extremity venous systems  to rule out residual clots. -Based on patient's preference if no concerns with persistent upper or lower extremity clots might discontinue Xarelto and switch the patient to aspirin. If the patient has significant chronic clots noted on a repeat ultrasound would need to discuss the risks versus benefit of discontinuation of anticoagulation and switching to aspirin. -If patient has persistent significant post-thrombotic syndrome symptoms might need to consider interventional radiology/vascular surgery evaluation to check for venous stenosis that might need an angioplasty.  Patient will return to follow-up with her primary care physician and prefers to get her ultrasounds with her primary care physician.  All of the patients questions were answered to her apparent satisfaction. The patient knows to call the clinic with any problems, questions or concerns.  I spent 40 minutes counseling the patient face to face. The total time spent in the appointment was 60 minutes and more than 50% was on counseling and direct patient cares.    Wyvonnia Lora MD Lindsay AAHIVMS [facility] Hematology/Oncology Physician [facility]  (Office):       [phone] (Work cell):  [phone] (Fax):           [phone]  05/29/2015 11:06 AM

## 2015-06-06 ENCOUNTER — Encounter: Payer: Self-pay | Admitting: Internal Medicine

## 2015-07-31 ENCOUNTER — Other Ambulatory Visit (HOSPITAL_COMMUNITY): Payer: Self-pay | Admitting: Family Medicine

## 2015-07-31 DIAGNOSIS — Z09 Encounter for follow-up examination after completed treatment for conditions other than malignant neoplasm: Secondary | ICD-10-CM

## 2015-08-01 ENCOUNTER — Ambulatory Visit (HOSPITAL_COMMUNITY): Payer: Medicare Other

## 2015-08-02 ENCOUNTER — Encounter (HOSPITAL_COMMUNITY): Payer: Medicare Other

## 2015-08-02 ENCOUNTER — Ambulatory Visit (HOSPITAL_COMMUNITY)
Admission: RE | Admit: 2015-08-02 | Discharge: 2015-08-02 | Disposition: A | Discharge status: Home / Self Care | Payer: Medicare Other | Source: Ambulatory Visit | Attending: Family Medicine | Admitting: Family Medicine

## 2015-08-02 DIAGNOSIS — Z09 Encounter for follow-up examination after completed treatment for conditions other than malignant neoplasm: Secondary | ICD-10-CM | POA: Diagnosis not present

## 2015-08-02 DIAGNOSIS — I82612 Acute embolism and thrombosis of superficial veins of left upper extremity: Secondary | ICD-10-CM | POA: Diagnosis not present

## 2015-08-02 DIAGNOSIS — I82A12 Acute embolism and thrombosis of left axillary vein: Secondary | ICD-10-CM | POA: Insufficient documentation

## 2015-08-02 NOTE — Progress Notes (Signed)
VASCULAR LAB PRELIMINARY  PRELIMINARY  PRELIMINARY  PRELIMINARY  Bilateral upper extremity venous duplex completed.    Preliminary report:  Bilateral:  No evidence of DVT or superficial thrombosis.  Previous left upper extremity thrombosis appears completely resolved.  [name], RVT 08/02/2015, 11:10 AM

## 2015-08-03 ENCOUNTER — Ambulatory Visit (HOSPITAL_COMMUNITY)
Admission: RE | Admit: 2015-08-03 | Discharge: 2015-08-03 | Disposition: A | Discharge status: Home / Self Care | Payer: Medicare Other | Source: Ambulatory Visit | Attending: Family Medicine | Admitting: Family Medicine

## 2015-08-03 DIAGNOSIS — Z09 Encounter for follow-up examination after completed treatment for conditions other than malignant neoplasm: Secondary | ICD-10-CM | POA: Diagnosis present

## 2015-08-03 NOTE — Progress Notes (Signed)
VASCULAR LAB PRELIMINARY  PRELIMINARY  PRELIMINARY  PRELIMINARY  Bilateral lower extremity venous duplex completed.    Preliminary report:  There is no DVT or SVT noted in the bilateral lower extremities.   Shanyce Daris, RVT 08/03/2015, 10:20 AM

## 2016-04-25 ENCOUNTER — Ambulatory Visit (INDEPENDENT_AMBULATORY_CARE_PROVIDER_SITE_OTHER): Payer: Medicare Other | Admitting: Internal Medicine

## 2016-04-25 ENCOUNTER — Encounter: Payer: Self-pay | Admitting: Internal Medicine

## 2016-04-25 VITALS — BP 134/82 | HR 88 | Wt 164.6 lb

## 2016-04-25 DIAGNOSIS — J449 Chronic obstructive pulmonary disease, unspecified: Secondary | ICD-10-CM | POA: Diagnosis not present

## 2016-04-25 DIAGNOSIS — J9612 Chronic respiratory failure with hypercapnia: Secondary | ICD-10-CM

## 2016-04-25 DIAGNOSIS — R05 Cough: Secondary | ICD-10-CM | POA: Diagnosis not present

## 2016-04-25 DIAGNOSIS — R058 Other specified cough: Secondary | ICD-10-CM | POA: Insufficient documentation

## 2016-04-25 NOTE — Patient Instructions (Addendum)
For drainage / throat tickle try take CHLORPHENIRAMINE  4 mg - take one every 4 hours as needed - available over the counter- may cause drowsiness so start with just a bedtime dose or two and see how you tolerate it before trying in daytime    Ok to adjust 02 to keep over 85% is the goal - you can purchase the monitor from [employer] or [facility] for under 40 dollars   GERD (REFLUX)  is an extremely common cause of respiratory symptoms just like yours , many times with no obvious heartburn at all.    It can be treated with medication, but also with lifestyle changes including elevation of the head of your bed (ideally with 6 inch  bed blocks),  Smoking cessation, avoidance of late meals, excessive alcohol, and avoid fatty foods, chocolate, peppermint, colas, red wine, and acidic juices such as orange juice.  NO MINT OR MENTHOL PRODUCTS SO NO COUGH DROPS  USE SUGARLESS CANDY INSTEAD (Jolley ranchers or Stover's or Life Savers) or even ice chips will also do - the key is to swallow to prevent all throat clearing. NO OIL BASED VITAMINS - use powdered substitutes.    If you feel your breathing is holding you back from desired activities please return

## 2016-04-25 NOTE — Assessment & Plan Note (Addendum)
-   02 dep 24/7 as of 05/06/14  - cor pulmonale by echo, mild,  03/25/14  - trial of anoro 05/06/14 > no better so resumed advair  - PFT's 06/09/2014 FEV1 0.71 (32%) p 15% better from saba, dlco 28% predicted with dlco 28% - Alpha one screening 06/09/2014 >  MM - 09/07/2014 p extensive coaching HFA effectiveness =    75%  - PFTs 01/19/15  FEV1  0.57 (26%) ratio 33 p 18% improvement from saba, dlco 23 corrects to 29%  - no better with lama/laba/ics so D/c'd 02/2015 and no worse off it   She refuses rehab or even anything more than prn saba and yet appears relatively well compensated on just 02 though very sedentary.   Really nothing more to offer in this clinic but we can see her back here to discuss palliative rx/ hospice when appropriate  NB: Though somewhat paradoxic, when the lung fails to clear C02 properly and pC02 rises the lung then becomes a more efficient scavenger of C02 allowing lower work of breathing and  better C02 clearance albeit at a higher serum pC02 level - this is why pts can look a lot better than their ABG's would suggest and why it's so difficult to prognosticate endstage dz.  It's also why I strongly rec DNI status (ventilating pts down to a nl pC02 adversely affects this compensatory mechanism)   I had an extended discussion with the patient reviewing all relevant studies completed to date and  lasting 15 to 20 minutes of a 25 minute visit    Each maintenance medication was reviewed in detail including most importantly the difference between maintenance and prns and under what circumstances the prns are to be triggered using an action plan format that is not reflected in the computer generated alphabetically organized AVS.    Please see instructions for details which were reviewed in writing and the patient given a copy highlighting the part that I personally wrote and discussed at today's ov.   Pulmonary f/u prn

## 2016-04-25 NOTE — Assessment & Plan Note (Signed)
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

## 2016-04-25 NOTE — Assessment & Plan Note (Signed)
-   RA sat at rest 05/06/2014 = 76% corrected on 2lpm  - 05/06/2014   Walked    x one lap @ 185 stopped due to  Sob but sats ok on 3lpm - HC03 34  05/06/14 c/w mild/moderate  Hypercarbia - 06/09/2014 requested eval for POC > 06/17/14 sats 88% on 4lpm pulsed with walking  - 04/25/2015   Walked 4lpm  x one lap @ 185 stopped due to  sats down to 80% s sob    rec as of  04/25/2016  : 2lpm 24/7 but ok to titrate up with activity to sat > 85%

## 2016-04-25 NOTE — Progress Notes (Signed)
Subjective:   Patient ID: [name], female    DOB: 09-Apr-1946  MRN: [id number]    Brief patient profile:  68 yowm quit smoking 2010 when admitted with pna did improve to the point June 2015 could walk across a parking lot into big stores/ ok on housework/no yardwork s 02 or pulmonary rx  then admitted [facility] July 2015 abruptly ill with dx CAP >  rehab and back at home since first week aug in 2015 and referred 05/06/2014 to pulmonary clinic by Dr Jeannetta Nap for persistent hypoxemia with 01/19/2015 pfts c/w GOLD IV with reversibility    Admit Date: 03/24/2014  Discharge Date:04/02/14  PRIMARY DISCHARGE DIAGNOSIS:   PNEUMONIA ORGANISM NOS  C O P D  SKIN RASH  Altered mental status  Transaminitis  Acute renal failure  Acute respiratory failure  Encephalopathy acute  Left Upper Ext DVT  COPD exacerbation  SIR'S    BRIEF HOSPITAL COURSE:  Active Problems:  Altered mental status  -Secondary to metabolic encephalopathy-resolved  -CT head negative  COPD exacerbation  -significantly better, with IV Steroids, nebs and IV antibitoics. By day of discharge, not hypoxic on room air this am, lungs clear on exam  - transition to oral prednisone, c/w scheduled nebs on discharge  SIRS  -secondary to PNA  -resolved with supportive Care  CAP  -afebrile, leukocytosis slowly decreasing. All cultures negative    -Has completed 7 days of IV Antibiotics, completed 5 days of Levaquin, given rash-levaquin-stopped on 03/30/14  Acute Hypoxic resp failure  -secondary to COPD,PNA , and acute diastolic CHF  -no longer hypoxic-now on room air  -lower ext doppler-negative for DVT-but positive for SVT in the Left leg. On ASA.Spoke with Dr Dickson-[facility] on call over the phone-since SVT in the distal thigh and not near the sapheno-femoral junction no need for anticoagulation, he recommends just ASA.However patient has Left upper ext DVT- rx Xarelto  Acute Diastolic CHF  -much more compensated, CXR done on  7/17-improved.Transitioned to oral Lasix.  -will need close monitoring of lytes as outpatient  Left Upper Ext DVT  -rx Xarelto on 7/17, please note, developed significantly worsened left upper arm swelling on 7/17-underwent Doppler Upper ext-positive for DVT noted in the axillary vein with superficial thrombus noted in the basilic and antecubital communicating veins. Also has Sup vein thrombosis in right cephalic vein  -Options for anticoagulation discussed, NOAC's vs Coumadin discussed. Bleeding risks discussed. Note daughter at bedside during this discussion. Patient at this time, prefers to be on Xarelto. Given persistent hypoxia, could have Pul Embolism, patient has a very small IV, offered to order a CT Angio chest, but at this time patient prefers not to get "stuck" again to get another IV access. Suspect we can just treat for 3-6 months, given clinical stability, suspect getting a CT Angio will not change the current management.  -Patient will require hematology evaluation prior to discontinuation of anticoagulation  Leukocytosis  -likely leukemoid reaction- see above regarding Abx. Also on steroids.  -WBC decreasing, tapering off steroids  Hyperkalemia  -on 7/16-?etiology  - treated with IV Insulin/D50/Kayexalate-resolved  ARF  -resolved  -suspect this was pre-renal  -seen by renal during her stay here  Transaminitis  -secondary to acute illness, acute hepatitis serology neg  -downtrending, Abd Ultrasound neg for acute abnormalities, but does have cholelithiasis-suspect this is chronic and not related to transaminitis  Diarrhea  -resolved  -C Diff PCR neg  Thrombocytopenia  -secondary to acute illness  -improving-platelets up to 140 K  today  Hypothyroidism  -c/w Levothyroxine  HTN  -c/w Bisoprolol  -BP controlled  12/06/2014 f/u ov/Lille Karim re: GOLD III copd/ 02 desp resp failure/ non adherent  Chief Complaint  Patient presents with  . Follow-up    Last seen 08/2014. States  feeling ok. No c/o SOB or wheezing voiced    Not limited by breathing from desired activities  But very sedentary and not consistent with 02 or advair but denies needing saba  rec 02 is 2lpm all the time except increase to 4lpm with walking outside  Please schedule a follow up office visit in 6 weeks, call sooner if needed with pfts on return and bring all inhalers with you     01/19/2015 f/u ov/Thomson Herbers re: COPD GOLD IV on advair and no need for saba  Chief Complaint  Patient presents with  . COPD    Breathing is unchanged since last OV. No new complaints today.  Doe x [employer] shopping s stopping 4lpm doesn't use HC parking - MMRC1  No housework  No need for saba  rec Add Incruse one puff each am to the advair each am only to see if can walk at faster pace or easier back up to your appt and if not improving after 2 weeks stop it     04/25/2015 f/u ov/Marthe Dant re: copd GOLD IV/ 02 dep/no worse off all rx but 02 / not using saba either  Chief Complaint  Patient presents with  . Follow-up    Had cxr today-not feeling any different,no cough,no wheeze,clears throat a lot-dry.Not using Advair or Incruse.  not using any inhalers at all at this point, Not limited by breathing from desired activities  But very sedentary rec No change in recommendations - you do not appear to need a maintenance treatment plan other than 02 which you should use 24/7 :  2lpm at rest/ sleep and 4lpm walking via your POC  Only use your albuterol as a rescue medication  Follow up can be yearly if needed to recertify for 02/ otherwise just see us back here as needed  Late add Needs repeat poc titration with any sat above 85% ok    04/25/2016  f/u ov/Latesa Fratto re: GOLD IV copd/ 02 24/7 varies some but doesn 't monitor sats  Chief Complaint  Patient presents with  . Follow-up    Breathing is unchanged. She denies any cough or wheeze, but daughter notes frequent throat clearing. She has not used albuterol inhaler.   sleeps ok  2lpm / no am cough/HA/ congestion/ very sedentary and denies limited by sob    No obvious day to day or daytime variabilty or assoc excess/ purulent sputum or mucus plugs   or cp or chest tightness, subjective wheeze overt sinus or hb symptoms. No unusual exp hx or h/o childhood pna/ asthma or knowledge of premature birth.  Sleeping ok without nocturnal  or early am exacerbation  of respiratory  c/o's or need for noct saba. Also denies any obvious fluctuation of symptoms with weather or environmental changes or other aggravating or alleviating factors except as outlined above   Current Medications, Allergies, Complete Past Medical History, Past Surgical History, Family History, and Social History were reviewed in [facility] electronic medical record.  ROS  The following are not active complaints unless bolded sore throat, dysphagia, dental problems, itching, sneezing,  nasal congestion or excess/ purulent secretions, ear ache,   fever, chills, sweats, unintended wt loss, pleuritic or exertional cp, hemoptysis,  orthopnea pnd  or leg swelling, presyncope, palpitations, heartburn, abdominal pain, anorexia, nausea, vomiting, diarrhea  or change in bowel or urinary habits, change in stools or urine, dysuria,hematuria,  rash, arthralgias, visual complaints, headache, numbness weakness or ataxia or problems with walking or coordination,  change in mood/affect or memory.             Objective:   Physical Exam   amb wf lets her daughter do the talking / note vital signs ok and sats 93% on arrival on 2.5 lpm   06/09/2014        152  > 09/07/2014  154 > 12/06/2014  153 >01/19/2015  159 > 04/25/2015    161 > 04/25/2016  165     04/02/14 166 lb 12.8 oz (75.66 kg)  04/03/09 123 lb (55.792 kg)  11/29/08 110 lb (49.896 kg)      HEENT mild turbinate edema.  Oropharynx no thrush or excess pnd or cobblestoning.  No JVD or cervical adenopathy. Mild accessory muscle hypertrophy. Trachea midline, nl thryroid.  Chest was hyperinflated by percussion with diminished breath sounds and moderate increased exp time with prominent pseudowheeze better with plm. Hoover sign positive at mid inspiration. Regular rate and rhythm without murmur gallop or rub or increase P2 or edema.  Abd: no hsm, nl excursion. Ext warm without cyanosis or clubbing.           Assessment & Plan:

## 2016-08-23 ENCOUNTER — Emergency Department (HOSPITAL_COMMUNITY)
Admission: EM | Admit: 2016-08-23 | Discharge: 2016-09-16 | Disposition: E | Payer: Medicare Other | Attending: Emergency Medicine | Admitting: Emergency Medicine

## 2016-08-23 ENCOUNTER — Encounter (HOSPITAL_COMMUNITY): Payer: Self-pay | Admitting: *Deleted

## 2016-08-23 DIAGNOSIS — E039 Hypothyroidism, unspecified: Secondary | ICD-10-CM | POA: Diagnosis not present

## 2016-08-23 DIAGNOSIS — Z7982 Long term (current) use of aspirin: Secondary | ICD-10-CM | POA: Diagnosis not present

## 2016-08-23 DIAGNOSIS — J449 Chronic obstructive pulmonary disease, unspecified: Secondary | ICD-10-CM | POA: Insufficient documentation

## 2016-08-23 DIAGNOSIS — I469 Cardiac arrest, cause unspecified: Secondary | ICD-10-CM | POA: Insufficient documentation

## 2016-08-23 DIAGNOSIS — F17200 Nicotine dependence, unspecified, uncomplicated: Secondary | ICD-10-CM | POA: Insufficient documentation

## 2016-08-23 DIAGNOSIS — I10 Essential (primary) hypertension: Secondary | ICD-10-CM | POA: Diagnosis not present

## 2016-09-16 NOTE — ED Notes (Signed)
Upon arrival pt on lucas.  Samuel BoucheLucas removed.  Pt asystolic.  US showed no cardiac activity.  TOD 1740.

## 2016-09-16 NOTE — ED Triage Notes (Signed)
Pt's grandaughter found pt unresponsive in recliner.  When fire arrived their aed stated no shock advised.  They placed king airway and started cpr.  When EMS arrived monitor showed asystole.  Color improved with compressions from lucas.  7 rounds of epi (given q 5).  1 amp bicarb.  Brown emesis suctioned airway.  Intubated by EMS.  Capnography spiked to 30-45.  HR went to pea en-route and upon arrival, pt became asystolic.

## 2016-09-16 NOTE — ED Provider Notes (Signed)
[facility]-EMERGENCY DEPT Provider Note   CSN: [id number] Arrival date & time: 08/28/2016  1738     History   Chief Complaint No chief complaint on file.   HPI Berta Minorancy Khader is a 71 y.o. female.   Illness  This is a new problem. The current episode started less than 1 hour ago. The problem occurs constantly. The problem has been rapidly worsening. Associated symptoms comments: Cardiac arrest. Treatments tried: CPR, epi.    Past Medical History:  Diagnosis Date  . CHF (congestive heart failure) (HCC)   . COPD (chronic obstructive pulmonary disease) (HCC)   . Hypertension     Patient Active Problem List   Diagnosis Date Noted  . Upper airway cough syndrome 04/25/2016  . Chronic respiratory failure with hypercapnia (HCC) 05/06/2014  . Dyspnea 05/06/2014  . Acute diastolic heart failure (HCC) 04/10/2014  . Diarrhea 04/10/2014  . Unspecified hypothyroidism 04/10/2014  . Altered mental status 03/24/2014  . SKIN RASH 10/20/2008  . DEPRESSION 10/05/2008  . HYPERTENSION 10/05/2008  . 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 No pertinent surgical history.  OB History    No data available       Home Medications    Prior to Admission medications   Medication Sig Start Date End Date Taking? Authorizing Provider  albuterol (PROAIR HFA) 108 (90 BASE) MCG/ACT inhaler Inhale 2 puffs into the lungs every 6 (six) hours as needed for wheezing or shortness of breath.    Historical Provider, MD  aspirin 81 MG tablet Take 81 mg by mouth daily.    Historical Provider, MD  bisoprolol (ZEBETA) 10 MG tablet Take 10 mg by mouth daily.    Historical Provider, MD  furosemide (LASIX) 40 MG tablet Take 1.5 tablets (60 mg total) by mouth 2 (two) times daily. 04/01/14   Shanker Levora DredgeM Ghimire, MD  levothyroxine (SYNTHROID, LEVOTHROID) 50 MCG tablet Take 50 mcg by mouth daily before breakfast.    Historical Provider, MD  Melatonin 3 MG TABS Take 1 tablet by mouth at bedtime.    Historical  Provider, MD  mirtazapine (REMERON) 30 MG tablet Take 30 mg by mouth at bedtime.    Historical Provider, MD  OXYGEN 2lpm with sleep and rest and 2.5 lpm with exertion  APS    Historical Provider, MD  pentoxifylline (TRENTAL) 400 MG CR tablet Take 400 mg by mouth daily.    Historical Provider, MD  potassium chloride (K-DUR) 10 MEQ tablet Take 10 mEq by mouth daily.    Historical Provider, MD  simvastatin (ZOCOR) 20 MG tablet Take 20 mg by mouth daily.    Historical Provider, MD    Family History Family History  Problem Relation Age of Onset  . Heart disease Mother   . Diabetes Mother   . Heart disease Father   . Diabetes Father     Social History Social History  Substance Use Topics  . Smoking status: Current Every Day Smoker    Last attempt to quit: 09/09/2009  . Smokeless tobacco: Never Used  . Alcohol use No     Allergies   Codeine and Penicillins   Review of Systems Review of Systems  Unable to perform ROS: Acuity of condition     Physical Exam Updated Vital Signs BP (!) 0/0 (BP Location: Left Arm)   Pulse (!) 0   Resp (!) 0   Wt 74.4 kg   SpO2 (!) 0%   BMI 28.60 kg/m   Physical Exam  Constitutional: She  appears well-developed and well-nourished.  HENT:  Head: Normocephalic and atraumatic.  Eyes: Conjunctivae are normal. Right eye exhibits no discharge. Left eye exhibits no discharge. Right pupil is not reactive. Left pupil is not reactive.  Neck: Neck supple.  Cardiovascular:  No murmur heard. Pulses:      Carotid pulses are 0 on the right side, and 0 on the left side.      Femoral pulses are 0 on the right side, and 0 on the left side. asystole  Pulmonary/Chest: Apnea noted.  Abdominal: Soft. There is no tenderness.  Musculoskeletal: She exhibits no edema or deformity.  Neurological: She is unresponsive. She exhibits abnormal muscle tone. GCS eye subscore is 1. GCS verbal subscore is 1. GCS motor subscore is 1.  Skin: Skin is dry. Capillary refill  takes more than 3 seconds. There is pallor.  Psychiatric: She has a normal mood and affect.  Nursing note and vitals reviewed.    ED Treatments / Results  Labs (all labs ordered are listed, but only abnormal results are displayed) Labs Reviewed - No data to display  EKG  EKG Interpretation None       Radiology No results found.  Procedures Procedures (including critical care time)   EMERGENCY DEPARTMENT US CARDIAC EXAM "Study: Limited Ultrasound of the heart and pericardium"  INDICATIONS:Cardiac arrest Multiple views of the heart and pericardium were obtained in real-time with a multi-frequency probe.  PERFORMED AO:ZHYQMVBY:Myself  IMAGES ARCHIVED?: No  FINDINGS: No cardiac activity  LIMITATIONS:  none  VIEWS USED: Subcostal 4 chamber, Parasternal long axis, Parasternal short axis and Apical 4 chamber   INTERPRETATION: Cardiac activity absent  Medications Ordered in ED Medications - No data to display   Initial Impression / Assessment and Plan / ED Course  I have reviewed the triage vital signs and the nursing notes.  Pertinent labs & imaging results that were available during my care of the patient were reviewed by me and considered in my medical decision making (see chart for details).  Clinical Course    71 year old female comes to us with active CPR for over an hour and 15 minutes. She was found unresponsive by her family, 10 minutes later fire department started CPR, they were transported to us. Multiple rounds of epinephrine intubation as well as chest compressions were performed with no response. Initial rhythm on our first pulse check was asystole. At that time patient was evaluated for any reflexes in cardiac activity on ultrasound. Nonreactive pupils and GCS of 3, ultrasound heart shows no cardiac activity. At that time after an hour and a half CPR. Code was called. This patient had a history of COPD and heart failure is likely that she succumbed to 1 of these  underlying conditions. Spoke with her primary care provider in the medical examiner, she will not be in any case and the primary care provider Dr. Windle GuardWilson Elkins will be happy to fill out the death certificate.  Final Clinical Impressions(s) / ED Diagnoses   Final diagnoses:  Cardiac arrest [facility](HCC)    New Prescriptions Discharge Medication List as of 09/11/2016  8:00 PM       Cherlynn PerchesEric Dakari Stabler, MD 2016-06-09 [phone]    Jerelyn ScottMartha Linker, MD 08/28/16 1620

## 2016-09-16 NOTE — Code Documentation (Signed)
Family updated as to patient's status.

## 2016-09-16 NOTE — Code Documentation (Signed)
Patient time of death occurred at [id number].

## 2016-09-16 NOTE — Progress Notes (Signed)
   2015-10-23 1900  Clinical Encounter Type  Visited With Family  Visit Type ED  Referral From Nurse  Consult/Referral To Chaplain  Spiritual Encounters  Spiritual Needs Grief support  Stress Factors  Family Stress Factors Loss  [facility] worked with family providing presence, support. Rodney BoozeGail L Dayrin Stallone 2015-10-23

## 2016-09-16 DEATH — deceased
